# Patient Record
Sex: Male | Born: 1945 | Race: White | Hispanic: No | Marital: Married | Smoking: Never smoker
Health system: Southern US, Community
[De-identification: ages and names within clinical notes are randomized; demographics above are authoritative.]

## PROBLEM LIST (undated history)

## (undated) DIAGNOSIS — Z789 Other specified health status: Secondary | ICD-10-CM

## (undated) DIAGNOSIS — M1990 Unspecified osteoarthritis, unspecified site: Secondary | ICD-10-CM

## (undated) DIAGNOSIS — I2510 Atherosclerotic heart disease of native coronary artery without angina pectoris: Secondary | ICD-10-CM

## (undated) DIAGNOSIS — C61 Malignant neoplasm of prostate: Secondary | ICD-10-CM

## (undated) HISTORY — PX: EYE SURGERY: SHX253

## (undated) HISTORY — PX: SHOULDER ARTHROSCOPY: SHX128

## (undated) HISTORY — PX: KNEE ARTHROSCOPY: SHX127

## (undated) HISTORY — PX: TRIGGER FINGER RELEASE: SHX641

## (undated) HISTORY — PX: DUPUYTREN / PALMAR FASCIOTOMY: SUR601

## (undated) HISTORY — PX: COLONOSCOPY: SHX174

---

## 1997-10-25 ENCOUNTER — Ambulatory Visit (HOSPITAL_COMMUNITY): Admission: RE | Admit: 1997-10-25 | Discharge: 1997-10-25 | Payer: Self-pay | Admitting: Orthopedic Surgery

## 1998-06-01 ENCOUNTER — Other Ambulatory Visit: Admission: RE | Admit: 1998-06-01 | Discharge: 1998-06-01 | Payer: Self-pay | Admitting: Orthopedic Surgery

## 1999-08-16 ENCOUNTER — Ambulatory Visit (HOSPITAL_COMMUNITY): Admission: RE | Admit: 1999-08-16 | Discharge: 1999-08-16 | Payer: Self-pay | Admitting: Neurosurgery

## 1999-08-16 ENCOUNTER — Encounter: Payer: Self-pay | Admitting: Neurosurgery

## 1999-09-03 ENCOUNTER — Encounter: Payer: Self-pay | Admitting: Neurosurgery

## 1999-09-03 ENCOUNTER — Ambulatory Visit (HOSPITAL_COMMUNITY): Admission: RE | Admit: 1999-09-03 | Discharge: 1999-09-03 | Payer: Self-pay | Admitting: Neurosurgery

## 2003-02-08 ENCOUNTER — Encounter: Admission: RE | Admit: 2003-02-08 | Discharge: 2003-02-08 | Payer: Self-pay | Admitting: Neurosurgery

## 2003-02-22 ENCOUNTER — Encounter: Admission: RE | Admit: 2003-02-22 | Discharge: 2003-02-22 | Payer: Self-pay | Admitting: Neurosurgery

## 2003-03-08 ENCOUNTER — Encounter: Admission: RE | Admit: 2003-03-08 | Discharge: 2003-03-08 | Payer: Self-pay | Admitting: Neurosurgery

## 2006-02-11 ENCOUNTER — Ambulatory Visit (HOSPITAL_BASED_OUTPATIENT_CLINIC_OR_DEPARTMENT_OTHER): Admission: RE | Admit: 2006-02-11 | Discharge: 2006-02-11 | Payer: Self-pay | Admitting: Orthopedic Surgery

## 2011-05-22 ENCOUNTER — Encounter (HOSPITAL_BASED_OUTPATIENT_CLINIC_OR_DEPARTMENT_OTHER): Payer: Self-pay | Admitting: *Deleted

## 2011-05-22 NOTE — Progress Notes (Signed)
No labs needed Active To bring boot and crutches

## 2011-05-23 ENCOUNTER — Encounter (HOSPITAL_BASED_OUTPATIENT_CLINIC_OR_DEPARTMENT_OTHER)
Admission: RE | Disposition: A | Discharge status: Home / Self Care | Payer: Self-pay | Source: Ambulatory Visit | Attending: Orthopedic Surgery

## 2011-05-23 ENCOUNTER — Encounter (HOSPITAL_BASED_OUTPATIENT_CLINIC_OR_DEPARTMENT_OTHER): Payer: Self-pay | Admitting: Anesthesiology

## 2011-05-23 ENCOUNTER — Ambulatory Visit (HOSPITAL_BASED_OUTPATIENT_CLINIC_OR_DEPARTMENT_OTHER)
Admission: RE | Admit: 2011-05-23 | Discharge: 2011-05-23 | Disposition: A | Discharge status: Home / Self Care | Payer: Medicare Other | Source: Ambulatory Visit | Attending: Orthopedic Surgery | Admitting: Orthopedic Surgery

## 2011-05-23 ENCOUNTER — Encounter (HOSPITAL_BASED_OUTPATIENT_CLINIC_OR_DEPARTMENT_OTHER): Payer: Self-pay

## 2011-05-23 ENCOUNTER — Ambulatory Visit (HOSPITAL_BASED_OUTPATIENT_CLINIC_OR_DEPARTMENT_OTHER): Payer: Medicare Other | Admitting: Anesthesiology

## 2011-05-23 DIAGNOSIS — S93499A Sprain of other ligament of unspecified ankle, initial encounter: Secondary | ICD-10-CM | POA: Insufficient documentation

## 2011-05-23 DIAGNOSIS — Z4789 Encounter for other orthopedic aftercare: Secondary | ICD-10-CM

## 2011-05-23 DIAGNOSIS — X58XXXA Exposure to other specified factors, initial encounter: Secondary | ICD-10-CM | POA: Insufficient documentation

## 2011-05-23 HISTORY — DX: Unspecified osteoarthritis, unspecified site: M19.90

## 2011-05-23 HISTORY — DX: Other specified health status: Z78.9

## 2011-05-23 HISTORY — PX: ACHILLES TENDON SURGERY: SHX542

## 2011-05-23 SURGERY — REPAIR, TENDON, ACHILLES
Anesthesia: General | Laterality: Left

## 2011-05-23 MED ORDER — LIDOCAINE HCL (CARDIAC) 20 MG/ML IV SOLN
INTRAVENOUS | Status: DC | PRN
  Administered 2011-05-23: 50 mg via INTRAVENOUS

## 2011-05-23 MED ORDER — MIDAZOLAM HCL 2 MG/2ML IJ SOLN
1.0000 mg | INTRAMUSCULAR | Status: DC | PRN
  Administered 2011-05-23: 2 mg via INTRAVENOUS

## 2011-05-23 MED ORDER — LORAZEPAM 2 MG/ML IJ SOLN: 1.0000 mg | Freq: Once | INTRAMUSCULAR | Status: DC | PRN

## 2011-05-23 MED ORDER — FENTANYL CITRATE 0.05 MG/ML IJ SOLN
50.0000 ug | INTRAMUSCULAR | Status: DC | PRN
  Administered 2011-05-23: 100 ug via INTRAVENOUS

## 2011-05-23 MED ORDER — ONDANSETRON HCL 4 MG/2ML IJ SOLN
INTRAMUSCULAR | Status: DC | PRN
  Administered 2011-05-23: 4 mg via INTRAVENOUS

## 2011-05-23 MED ORDER — SUCCINYLCHOLINE CHLORIDE 20 MG/ML IJ SOLN
INTRAMUSCULAR | Status: DC | PRN
  Administered 2011-05-23: 100 mg via INTRAVENOUS

## 2011-05-23 MED ORDER — LACTATED RINGERS IV SOLN
INTRAVENOUS | Status: DC
  Administered 2011-05-23: 13:00:00 via INTRAVENOUS

## 2011-05-23 MED ORDER — DEXAMETHASONE SODIUM PHOSPHATE 4 MG/ML IJ SOLN
INTRAMUSCULAR | Status: DC | PRN
  Administered 2011-05-23: 10 mg via INTRAVENOUS

## 2011-05-23 MED ORDER — CEFAZOLIN SODIUM-DEXTROSE 2-3 GM-% IV SOLR
2.0000 g | Freq: Once | INTRAVENOUS | Status: AC
  Administered 2011-05-23: 2 g via INTRAVENOUS

## 2011-05-23 MED ORDER — HYDROMORPHONE HCL PF 1 MG/ML IJ SOLN: 0.2500 mg | INTRAMUSCULAR | Status: DC | PRN

## 2011-05-23 MED ORDER — PROPOFOL 10 MG/ML IV EMUL
INTRAVENOUS | Status: DC | PRN
  Administered 2011-05-23: 200 mg via INTRAVENOUS

## 2011-05-23 SURGICAL SUPPLY — 67 items
ANCHOR CORKSCREW FIBER 5.5X15 (Anchor) ×2 IMPLANT
BANDAGE ELASTIC 4 VELCRO ST LF (GAUZE/BANDAGES/DRESSINGS) ×2 IMPLANT
BANDAGE ELASTIC 6 VELCRO ST LF (GAUZE/BANDAGES/DRESSINGS) ×2 IMPLANT
BANDAGE ESMARK 6X9 LF (GAUZE/BANDAGES/DRESSINGS) ×1 IMPLANT
BLADE SURG 15 STRL LF DISP TIS (BLADE) ×1 IMPLANT
BLADE SURG 15 STRL SS (BLADE) ×1
BLADE SURG ROTATE 9660 (MISCELLANEOUS) ×2 IMPLANT
BNDG COHESIVE 4X5 TAN STRL (GAUZE/BANDAGES/DRESSINGS) ×2 IMPLANT
BNDG ESMARK 6X9 LF (GAUZE/BANDAGES/DRESSINGS) ×2
CANISTER SUCTION 1200CC (MISCELLANEOUS) ×2 IMPLANT
CLOTH BEACON ORANGE TIMEOUT ST (SAFETY) ×2 IMPLANT
COVER TABLE BACK 60X90 (DRAPES) ×2 IMPLANT
CUFF TOURNIQUET SINGLE 34IN LL (TOURNIQUET CUFF) ×2 IMPLANT
DRAPE EXTREMITY T 121X128X90 (DRAPE) ×2 IMPLANT
DRAPE OEC MINIVIEW 54X84 (DRAPES) ×2 IMPLANT
DRAPE U 20/CS (DRAPES) ×2 IMPLANT
DRAPE U-SHAPE 47X51 STRL (DRAPES) ×2 IMPLANT
DRSG PAD ABDOMINAL 8X10 ST (GAUZE/BANDAGES/DRESSINGS) ×2 IMPLANT
DURAPREP 26ML APPLICATOR (WOUND CARE) ×2 IMPLANT
ELECT REM PT RETURN 9FT ADLT (ELECTROSURGICAL) ×2
ELECTRODE REM PT RTRN 9FT ADLT (ELECTROSURGICAL) ×1 IMPLANT
GAUZE XEROFORM 1X8 LF (GAUZE/BANDAGES/DRESSINGS) ×2 IMPLANT
GLOVE BIO SURGEON STRL SZ 6.5 (GLOVE) ×2 IMPLANT
GLOVE BIOGEL PI IND STRL 7.0 (GLOVE) ×1 IMPLANT
GLOVE BIOGEL PI IND STRL 8 (GLOVE) ×1 IMPLANT
GLOVE BIOGEL PI INDICATOR 7.0 (GLOVE) ×1
GLOVE BIOGEL PI INDICATOR 8 (GLOVE) ×1
GLOVE ORTHO TXT STRL SZ7.5 (GLOVE) ×4 IMPLANT
GOWN BRE IMP PREV XXLGXLNG (GOWN DISPOSABLE) ×2 IMPLANT
GOWN PREVENTION PLUS XLARGE (GOWN DISPOSABLE) ×4 IMPLANT
NDL SUT 6 .5 CRC .975X.05 MAYO (NEEDLE) ×1 IMPLANT
NEEDLE HYPO 22GX1.5 SAFETY (NEEDLE) IMPLANT
NEEDLE MAYO TAPER (NEEDLE) ×1
NS IRRIG 1000ML POUR BTL (IV SOLUTION) ×2 IMPLANT
PACK BASIN DAY SURGERY FS (CUSTOM PROCEDURE TRAY) ×2 IMPLANT
PAD CAST 4YDX4 CTTN HI CHSV (CAST SUPPLIES) ×1 IMPLANT
PADDING CAST ABS 4INX4YD NS (CAST SUPPLIES) ×2
PADDING CAST ABS COTTON 4X4 ST (CAST SUPPLIES) ×2 IMPLANT
PADDING CAST COTTON 4X4 STRL (CAST SUPPLIES) ×1
PADDING CAST COTTON 6X4 STRL (CAST SUPPLIES) ×2 IMPLANT
PENCIL BUTTON HOLSTER BLD 10FT (ELECTRODE) ×2 IMPLANT
SLEEVE SCD COMPRESS KNEE MED (MISCELLANEOUS) IMPLANT
SPLINT FAST PLASTER 5X30 (CAST SUPPLIES)
SPLINT FIBERGLASS 4X30 (CAST SUPPLIES) ×2 IMPLANT
SPLINT PLASTER CAST FAST 5X30 (CAST SUPPLIES) IMPLANT
SPONGE GAUZE 4X4 12PLY (GAUZE/BANDAGES/DRESSINGS) ×2 IMPLANT
STAPLER VISISTAT 35W (STAPLE) IMPLANT
STOCKINETTE 6  STRL (DRAPES) ×1
STOCKINETTE 6 STRL (DRAPES) ×1 IMPLANT
SUCTION FRAZIER TIP 10 FR DISP (SUCTIONS) IMPLANT
SUT ETHILON 3 0 PS 1 (SUTURE) IMPLANT
SUT FIBERWIRE #2 38 T-5 BLUE (SUTURE) ×8
SUT VIC AB 0 CT1 27 (SUTURE) ×1
SUT VIC AB 0 CT1 27XBRD ANBCTR (SUTURE) ×1 IMPLANT
SUT VIC AB 2-0 SH 27 (SUTURE)
SUT VIC AB 2-0 SH 27XBRD (SUTURE) IMPLANT
SUT VIC AB 3-0 SH 27 (SUTURE) ×1
SUT VIC AB 3-0 SH 27X BRD (SUTURE) ×1 IMPLANT
SUTURE FIBERWR #2 38 T-5 BLUE (SUTURE) ×4 IMPLANT
SYR BULB 3OZ (MISCELLANEOUS) ×2 IMPLANT
SYR CONTROL 10ML LL (SYRINGE) IMPLANT
TOWEL OR 17X24 6PK STRL BLUE (TOWEL DISPOSABLE) ×4 IMPLANT
TOWEL OR NON WOVEN STRL DISP B (DISPOSABLE) ×2 IMPLANT
TUBE CONNECTING 20X1/4 (TUBING) ×2 IMPLANT
UNDERPAD 30X30 INCONTINENT (UNDERPADS AND DIAPERS) IMPLANT
WATER STERILE IRR 1000ML POUR (IV SOLUTION) IMPLANT
YANKAUER SUCT BULB TIP NO VENT (SUCTIONS) ×2 IMPLANT

## 2011-05-23 NOTE — H&P (Signed)
  Hard copy of H&P attached to chart  66 yo male acute achilles rupture left confirmed by exam and ultrasound  Brought to OR for repair of LEFT Achilles tendon  All aspects of diagnosis and treatment options discussed. Risks, benefits, complications review

## 2011-05-23 NOTE — Transfer of Care (Signed)
Immediate Anesthesia Transfer of Care Note  Patient: Vernon Thomas  Procedure(s) Performed: Procedure(s) (LRB): ACHILLES TENDON REPAIR (Left)  Patient Location: PACU  Anesthesia Type: General  Level of Consciousness: awake  Airway & Oxygen Therapy: Patient Spontanous Breathing and Patient connected to face mask oxygen  Post-op Assessment: Report given to PACU RN and Post -op Vital signs reviewed and stable  Post vital signs: Reviewed and stable  Complications: No apparent anesthesia complications

## 2011-05-23 NOTE — Progress Notes (Signed)
Assisted Dr. Kasik with left, popliteal block. Side rails up, monitors on throughout procedure. See vital signs in flow sheet. Tolerated Procedure well. 

## 2011-05-23 NOTE — Interval H&P Note (Signed)
History and Physical Interval Note:  05/23/2011 12:54 PM  Ward Givens  has presented today for surgery, with the diagnosis of left ankle rupture tendon achilles  The various methods of treatment have been discussed with the patient and family. After consideration of risks, benefits and other options for treatment, the patient has consented to  Procedure(s) (LRB): ACHILLES TENDON REPAIR (Left) as a surgical intervention .  The patients' history has been reviewed, patient examined, no change in status, stable for surgery.  I have reviewed the patients' chart and labs.  Questions were answered to the patient's satisfaction.     MURPHY,DANIEL F

## 2011-05-23 NOTE — Anesthesia Postprocedure Evaluation (Signed)
Anesthesia Post Note  Patient: Vernon Thomas  Procedure(s) Performed: Procedure(s) (LRB): ACHILLES TENDON REPAIR (Left)  Anesthesia type: General  Patient location: PACU  Post pain: Pain level controlled and Adequate analgesia  Post assessment: Post-op Vital signs reviewed, Patient's Cardiovascular Status Stable, Respiratory Function Stable, Patent Airway and Pain level controlled  Last Vitals:  Filed Vitals:   05/23/11 1445  BP: 128/77  Pulse: 86  Temp:   Resp: 19    Post vital signs: Reviewed and stable  Level of consciousness: awake, alert  and oriented  Complications: No apparent anesthesia complications

## 2011-05-23 NOTE — Brief Op Note (Signed)
05/23/2011  2:44 PM  PATIENT:  Vernon Thomas  66 y.o. male  PRE-OPERATIVE DIAGNOSIS:  left ankle rupture tendon achilles  POST-OPERATIVE DIAGNOSIS:  left ankle rupture tendon achilles  PROCEDURE:  Procedure(s) (LRB): ACHILLES TENDON REPAIR (Left)  SURGEON:  Surgeon(s) and Role:    * Loreta Ave, MD - Primary  PHYSICIAN ASSISTANT: Zonia Kief M   ANESTHESIA:   general  EBL:  Total I/O In: 1500 [I.V.:1500] Out: -    DISPOSITION OF SPECIMEN:  N/A  COUNTS:  YES  TOURNIQUET:   Total Tourniquet Time Documented: Thigh (Left) - 62 minutes  PATIENT DISPOSITION:  PACU - hemodynamically stable.

## 2011-05-23 NOTE — Anesthesia Preprocedure Evaluation (Signed)
Anesthesia Evaluation  Patient identified by MRN, date of birth, ID band Patient awake    Reviewed: Allergy & Precautions, H&P , NPO status , Patient's Chart, lab work & pertinent test results  Airway Mallampati: I TM Distance: >3 FB Neck ROM: Full    Dental   Pulmonary    Pulmonary exam normal       Cardiovascular     Neuro/Psych    GI/Hepatic   Endo/Other    Renal/GU      Musculoskeletal   Abdominal   Peds  Hematology   Anesthesia Other Findings   Reproductive/Obstetrics                           Anesthesia Physical Anesthesia Plan  ASA: I  Anesthesia Plan: General   Post-op Pain Management:    Induction: Intravenous  Airway Management Planned: Oral ETT  Additional Equipment:   Intra-op Plan:   Post-operative Plan: Extubation in OR  Informed Consent: I have reviewed the patients History and Physical, chart, labs and discussed the procedure including the risks, benefits and alternatives for the proposed anesthesia with the patient or authorized representative who has indicated his/her understanding and acceptance.     Plan Discussed with: CRNA and Surgeon  Anesthesia Plan Comments:         Anesthesia Quick Evaluation  

## 2011-05-23 NOTE — Discharge Instructions (Signed)
   [facility]  [address] [place], [place] [address] [phone]   Post Anesthesia Home Care Instructions  Activity: Get plenty of rest for the remainder of the day. A responsible adult should stay with you for 24 hours following the procedure.  For the next 24 hours, DO NOT: -Drive a car -Advertising copywriter -Drink alcoholic beverages -Take any medication unless instructed by your physician -Make any legal decisions or sign important papers.  Meals: Start with liquid foods such as gelatin or soup. Progress to regular foods as tolerated. Avoid greasy, spicy, heavy foods. If nausea and/or vomiting occur, drink only clear liquids until the nausea and/or vomiting subsides. Call your physician if vomiting continues.  Special Instructions/Symptoms: Your throat may feel dry or sore from the anesthesia or the breathing tube placed in your throat during surgery. If this causes discomfort, gargle with warm salt water. The discomfort should disappear within 24 hours.     Anesthesia Blocks  1. Numbness or the inability to move the "blocked" extremity may last from 3-48 hours after placement. The length of time depends on the medication injected and your individual response to the medication. If the numbness is not going away after 48 hours, call your surgeon.  2. The extremity that is blocked will need to be protected until the numbness is gone and the  Strength has returned. Because you cannot feel it, you will need to take extra care to avoid injury. Because it may be weak, you may have difficulty moving it or using it. You may not know what position it is in without looking at it while the block is in effect.  3. For blocks in the legs and feet, returning to weight bearing and walking needs to be done carefully. You will need to wait until the numbness is entirely gone and the strength has returned. You should be able to move your leg and foot normally before you try  and bear weight or walk. You will need someone to be with you when you first try to ensure you do not fall and possibly risk injury.  4. Bruising and tenderness at the needle site are common side effects and will resolve in a few days.  5. Persistent numbness or new problems with movement should be communicated to the surgeon or the [facility] [phone]).

## 2011-05-23 NOTE — Anesthesia Procedure Notes (Addendum)
Anesthesia Regional Block:  Popliteal block  Pre-Anesthetic Checklist: ,, timeout performed, Correct Patient, Correct Site, Correct Laterality, Correct Procedure, Correct Position, site marked, Risks and benefits discussed,  Surgical consent,  Pre-op evaluation,  At surgeon's request and post-op pain management  Laterality: Left  Prep: chloraprep       Needles:  Injection technique: Single-shot  Needle Type: Stimulator Needle - 80      Needle Gauge: 22 and 22 G    Additional Needles:  Procedures: nerve stimulator Popliteal block  Nerve Stimulator or Paresthesia:  Response: 0.5 mA,   Additional Responses:   Narrative:  Start time: 05/23/2011 12:40 PM End time: 05/23/2011 12:54 PM Injection made incrementally with aspirations every 5 mL. Anesthesiologist: Dr Gypsy Balsam  Additional Notes: [id number] POP L Pop N Block CHG prep, sterile tech, barrier precautions #22 stim neddle with stim down to .5ma Multiple neg asp Vernia Buff .5% w/epi1:200000 total 50cc No compl Dr Gypsy Balsam   Procedure Name: Intubation Performed by: York Grice Pre-anesthesia Checklist: Patient identified, Timeout performed, Emergency Drugs available, Suction available and Patient being monitored Patient Re-evaluated:Patient Re-evaluated prior to inductionOxygen Delivery Method: Circle system utilized Preoxygenation: Pre-oxygenation with 100% oxygen Intubation Type: IV induction Ventilation: Mask ventilation without difficulty Laryngoscope Size: Miller and 2 Grade View: Grade I Tube type: Oral Tube size: 8.0 mm Number of attempts: 2 Airway Equipment and Method: Video-laryngoscopy Placement Confirmation: breath sounds checked- equal and bilateral,  ETT inserted through vocal cords under direct vision and positive ETCO2 Secured at: 22 cm Tube secured with: Tape Dental Injury: Teeth and Oropharynx as per pre-operative assessment

## 2011-05-28 ENCOUNTER — Encounter (HOSPITAL_BASED_OUTPATIENT_CLINIC_OR_DEPARTMENT_OTHER): Payer: Self-pay | Admitting: Orthopedic Surgery

## 2011-05-28 NOTE — Op Note (Signed)
NAME:  Vernon Thomas, Vernon Thomas                 ACCOUNT NO.:  MEDICAL RECORD NO.:  [id number]  LOCATION:                                 FACILITY:  PHYSICIAN:  Loreta Ave, M.D.      DATE OF BIRTH:  DATE OF PROCEDURE:  05/23/2011 DATE OF DISCHARGE:                              OPERATIVE REPORT   PREOPERATIVE DIAGNOSIS:  Mid-substance Achilles tendon tear, left.  POSTOPERATIVE DIAGNOSES:  Complex 2-layer tear Achilles tendon.  Deep- half mid-substance watershed region.  Superficial-half avulsed off the os calcis with small bony fragments.  PROCEDURE:  Exploration of left Achilles and repair of mid-substance tendon tear.  Re-attachment of distal tear with a 5.5 Bio-Anchor and FiberWire suture.  SURGEON:  Loreta Ave, MD  ASSISTANT:  Genene Churn. Barry Dienes, [place], present throughout the entire case and necessary for timely completion of the procedure.  ANESTHESIA:  General.  BLOOD LOSS:  Minimal.  SPECIMENS:  None.  CULTURES:  None.  COMPLICATION:  None.  DRESSINGS:  Soft compressive short leg splint.  TOURNIQUET TIME:  One hour.  PROCEDURE:  The patient was brought to the operating room and placed on the operating table in supine position.  After adequate anesthesia had been obtained, tourniquet applied left leg.  Turned to appropriate position with appropriate padding and support in a prone position.  The leg was exsanguinated with elevation and Esmarch, tourniquet inflated to 350 mmHg.  Defected tendon in the watershed region.  A longitudinal incision was slightly lateral.  Skin and subcutaneous tissue divided. Tear immediately evident.  This was carefully dissected out and it became more, more apparent.  There was a two-layer tear.  I carried the incision all way down to the level of the os calcis.  After debriding all of this, there was extensive intratendinous tearing at both ends. The more superficial-half had avulsed off the os calcis with bone fragments within it.   Fluoroscopic guidance was used to confirm that. The os calcis avulsed off was debrided and a 5-mm Bio-Anchor with FiberWire suture was placed there.  I then reflected the superficial tear approximately.  I then did an end-to-end repair of the deep tear weaving the FiberWire suture well above and well below the tendon- capturing stitch.  This was then tied end-to-end with appropriate tension.  The superficial tear brought over top of that and then that was firmly tied down to the os calcis with a FiberWire suture weaved well up above the other tear and then back down restoring good longitudinal continuity and tensioning of the entire tendon not only the mid-substance tear but down to the heel.  I could dorsiflex slightly with the knee flexed confirming not too much tension.  Wound was irrigated.  Closed with Vicryl and staples.  Sterile compressive dressing applied.  A plantar gravity flexed position of the foot, short- leg splint applied.  Returned to supine position.  Anesthesia reversed. Brought to the recovery room.  Tolerated the surgery well.  There were no complications.     Loreta Ave, M.D.     DFM/MEDQ  D:  05/27/2011  T:  05/27/2011  Job:  [id number]

## 2014-02-25 HISTORY — PX: OTHER SURGICAL HISTORY: SHX169

## 2014-03-01 ENCOUNTER — Encounter: Payer: Self-pay | Admitting: Gastroenterology

## 2014-03-11 ENCOUNTER — Ambulatory Visit (AMBULATORY_SURGERY_CENTER): Payer: Self-pay | Admitting: *Deleted

## 2014-03-11 VITALS — Ht 68.0 in | Wt 181.0 lb

## 2014-03-11 DIAGNOSIS — Z1211 Encounter for screening for malignant neoplasm of colon: Secondary | ICD-10-CM

## 2014-03-11 MED ORDER — NA SULFATE-K SULFATE-MG SULF 17.5-3.13-1.6 GM/177ML PO SOLN: 1.0000 | Freq: Once | ORAL | Status: DC

## 2014-03-11 NOTE — Progress Notes (Signed)
No egg or soy allergy, no issues with past sedation, no home 02 , no diet pills. ewm Pt declined emmi video. ewm

## 2014-03-25 ENCOUNTER — Ambulatory Visit (AMBULATORY_SURGERY_CENTER): Payer: PPO | Admitting: Gastroenterology

## 2014-03-25 ENCOUNTER — Encounter: Payer: Self-pay | Admitting: Gastroenterology

## 2014-03-25 VITALS — BP 117/67 | HR 48 | Temp 96.5°F | Resp 17 | Ht 68.0 in | Wt 181.0 lb

## 2014-03-25 DIAGNOSIS — Z1211 Encounter for screening for malignant neoplasm of colon: Secondary | ICD-10-CM

## 2014-03-25 MED ORDER — SODIUM CHLORIDE 0.9 % IV SOLN: 500.0000 mL | INTRAVENOUS | Status: DC

## 2014-03-25 NOTE — Progress Notes (Signed)
Report to PACU, RN, vss, BBS= Clear.  

## 2014-03-25 NOTE — Op Note (Signed)
[place]  [address] [place], [address]   COLONOSCOPY PROCEDURE REPORT  PATIENT: Vernon Thomas, Vernon Thomas  MR#: [id number] BIRTHDATE: October 30, 1945 , 33  yrs. old GENDER: male ENDOSCOPIST: Inda Castle, MD REFERRED HW:TUUEK McKenzie, M.D. PROCEDURE DATE:  03/25/2014 PROCEDURE:   Colonoscopy, diagnostic First Screening Colonoscopy - Avg.  risk and is 50 yrs.  old or older - No.  Prior Negative Screening - Now for repeat screening. 10 or more years since last screening  History of Adenoma - Now for follow-up colonoscopy & has been > or = to 3 yrs.  N/A  Polyps Removed Today? No.  Recommend repeat exam, <10 yrs? No. ASA CLASS:   Class II INDICATIONS:average risk for colon cancer. MEDICATIONS: Monitored anesthesia care and Propofol 200 mg IV  DESCRIPTION OF PROCEDURE:   After the risks benefits and alternatives of the procedure were thoroughly explained, informed consent was obtained.  The digital rectal exam revealed no abnormalities of the rectum.   The LB [id number]  endoscope was introduced through the anus and advanced to the cecum, which was identified by both the appendix and ileocecal valve. No adverse events experienced.   The quality of the prep was good, using MoviPrep  The instrument was then slowly withdrawn as the colon was fully examined.      COLON FINDINGS: A normal appearing cecum, ileocecal valve, and appendiceal orifice were identified.  the ascending, transverse, descending, sigmoid colon, and rectum appeared unremarkable. Retroflexed views revealed no abnormalities. The time to cecum=5 minutes 52 seconds.  Withdrawal time=7 minutes 57 seconds.  The scope was withdrawn and the procedure completed. COMPLICATIONS: There were no complications.  ENDOSCOPIC IMPRESSION: Normal colonoscopy  RECOMMENDATIONS: Given your age, you will not need another colonoscopy for colon cancer screening or polyp surveillance.  These types of  tests usually stop around the age 61.  eSigned:  Inda Castle, MD 03/25/2014 1:24 PM   cc:

## 2014-03-25 NOTE — Patient Instructions (Signed)
Impressions/recommendations:  Normal colonoscopy  YOU HAD AN ENDOSCOPIC PROCEDURE TODAY AT [facility]: Refer to the procedure report that was given to you for any specific questions about what was found during the examination.  If the procedure report does not answer your questions, please call your gastroenterologist to clarify.  If you requested that your care partner not be given the details of your procedure findings, then the procedure report has been included in a sealed envelope for you to review at your convenience later.  YOU SHOULD EXPECT: Some feelings of bloating in the abdomen. Passage of more gas than usual.  Walking can help get rid of the air that was put into your GI tract during the procedure and reduce the bloating. If you had a lower endoscopy (such as a colonoscopy or flexible sigmoidoscopy) you may notice spotting of blood in your stool or on the toilet paper. If you underwent a bowel prep for your procedure, then you may not have a normal bowel movement for a few days.  DIET: Your first meal following the procedure should be a light meal and then it is ok to progress to your normal diet.  A half-sandwich or bowl of soup is an example of a good first meal.  Heavy or fried foods are harder to digest and may make you feel nauseous or bloated.  Likewise meals heavy in dairy and vegetables can cause extra gas to form and this can also increase the bloating.  Drink plenty of fluids but you should avoid alcoholic beverages for 24 hours.  ACTIVITY: Your care partner should take you home directly after the procedure.  You should plan to take it easy, moving slowly for the rest of the day.  You can resume normal activity the day after the procedure however you should NOT DRIVE or use heavy machinery for 24 hours (because of the sedation medicines used during the test).    SYMPTOMS TO REPORT IMMEDIATELY: A gastroenterologist can be reached at any hour.  During normal  business hours, 8:30 AM to 5:00 PM Monday through Friday, call [phone].  After hours and on weekends, please call the GI answering service at [phone] who will take a message and have the physician on call contact you.   Following lower endoscopy (colonoscopy or flexible sigmoidoscopy):  Excessive amounts of blood in the stool  Significant tenderness or worsening of abdominal pains  Swelling of the abdomen that is new, acute  Fever of 100F or higher  FOLLOW UP: If any biopsies were taken you will be contacted by phone or by letter within the next 1-3 weeks.  Call your gastroenterologist if you have not heard about the biopsies in 3 weeks.  Our staff will call the home number listed on your records the next business day following your procedure to check on you and address any questions or concerns that you may have at that time regarding the information given to you following your procedure. This is a courtesy call and so if there is no answer at the home number and we have not heard from you through the emergency physician on call, we will assume that you have returned to your regular daily activities without incident.  SIGNATURES/CONFIDENTIALITY: You and/or your care partner have signed paperwork which will be entered into your electronic medical record.  These signatures attest to the fact that that the information above on your After Visit Summary has been reviewed and is understood.  Full responsibility  of the confidentiality of this discharge information lies with you and/or your care-partner.

## 2014-03-28 ENCOUNTER — Telehealth: Payer: Self-pay | Admitting: *Deleted

## 2014-03-28 NOTE — Telephone Encounter (Signed)
  Follow up Call-  Call back number 03/25/2014  Post procedure Call Back phone  # [phone]  Permission to leave phone message Yes    [facility]

## 2014-04-22 DIAGNOSIS — I200 Unstable angina: Secondary | ICD-10-CM

## 2014-04-26 ENCOUNTER — Encounter (HOSPITAL_COMMUNITY)
Admission: RE | Disposition: A | Discharge status: Home / Self Care | Payer: PPO | Source: Ambulatory Visit | Attending: Cardiology

## 2014-04-26 ENCOUNTER — Ambulatory Visit (HOSPITAL_COMMUNITY)
Admission: RE | Admit: 2014-04-26 | Discharge: 2014-04-27 | Disposition: A | Discharge status: Home / Self Care | Payer: PPO | Source: Ambulatory Visit | Attending: Cardiology | Admitting: Cardiology

## 2014-04-26 ENCOUNTER — Other Ambulatory Visit: Payer: Self-pay

## 2014-04-26 DIAGNOSIS — I200 Unstable angina: Secondary | ICD-10-CM

## 2014-04-26 DIAGNOSIS — I2510 Atherosclerotic heart disease of native coronary artery without angina pectoris: Secondary | ICD-10-CM | POA: Insufficient documentation

## 2014-04-26 DIAGNOSIS — E780 Pure hypercholesterolemia: Secondary | ICD-10-CM | POA: Diagnosis not present

## 2014-04-26 DIAGNOSIS — Z7982 Long term (current) use of aspirin: Secondary | ICD-10-CM | POA: Diagnosis not present

## 2014-04-26 DIAGNOSIS — E785 Hyperlipidemia, unspecified: Secondary | ICD-10-CM | POA: Diagnosis not present

## 2014-04-26 DIAGNOSIS — Z9861 Coronary angioplasty status: Secondary | ICD-10-CM

## 2014-04-26 DIAGNOSIS — M1990 Unspecified osteoarthritis, unspecified site: Secondary | ICD-10-CM | POA: Insufficient documentation

## 2014-04-26 HISTORY — PX: LEFT HEART CATHETERIZATION WITH CORONARY ANGIOGRAM: SHX5451

## 2014-04-26 LAB — BASIC METABOLIC PANEL
Anion gap: 6 (ref 5–15)
BUN: 21 mg/dL (ref 6–23)
CO2: 28 mmol/L (ref 19–32)
CREATININE: 1.1 mg/dL (ref 0.50–1.35)
Calcium: 9 mg/dL (ref 8.4–10.5)
Chloride: 106 mmol/L (ref 96–112)
GFR, EST AFRICAN AMERICAN: 76 mL/min — AB (ref >90)
GFR, EST NON AFRICAN AMERICAN: 66 mL/min — AB (ref >90)
Glucose, Bld: 105 mg/dL — ABNORMAL HIGH (ref 70–99)
POTASSIUM: 4 mmol/L (ref 3.5–5.1)
Sodium: 140 mmol/L (ref 135–145)

## 2014-04-26 LAB — CBC
HCT: 44.4 % (ref 39.0–52.0)
HEMOGLOBIN: 15.2 g/dL (ref 13.0–17.0)
MCH: 32.3 pg (ref 26.0–34.0)
MCHC: 34.2 g/dL (ref 30.0–36.0)
MCV: 94.3 fL (ref 78.0–100.0)
Platelets: 189 10*3/uL (ref 150–400)
RBC: 4.71 MIL/uL (ref 4.22–5.81)
RDW: 13.7 % (ref 11.5–15.5)
WBC: 6.5 10*3/uL (ref 4.0–10.5)

## 2014-04-26 LAB — PROTIME-INR
INR: 1.05 (ref 0.00–1.49)
PROTHROMBIN TIME: 13.8 s (ref 11.6–15.2)

## 2014-04-26 LAB — POCT ACTIVATED CLOTTING TIME: ACTIVATED CLOTTING TIME: 577 s

## 2014-04-26 SURGERY — LEFT HEART CATHETERIZATION WITH CORONARY ANGIOGRAM

## 2014-04-26 MED ORDER — METOPROLOL TARTRATE 12.5 MG HALF TABLET
12.5000 mg | ORAL_TABLET | Freq: Two times a day (BID) | ORAL | Status: DC
  Administered 2014-04-27: 12.5 mg via ORAL
  Filled 2014-04-26: qty 1

## 2014-04-26 MED ORDER — ACETAMINOPHEN 325 MG PO TABS: 650.0000 mg | ORAL_TABLET | ORAL | Status: DC | PRN

## 2014-04-26 MED ORDER — ASPIRIN EC 81 MG PO TBEC: 81.0000 mg | DELAYED_RELEASE_TABLET | Freq: Every day | ORAL | Status: DC

## 2014-04-26 MED ORDER — SODIUM CHLORIDE 0.9 % IV SOLN: 250.0000 mL | INTRAVENOUS | Status: DC | PRN

## 2014-04-26 MED ORDER — HEPARIN (PORCINE) IN NACL 2-0.9 UNIT/ML-% IJ SOLN
INTRAMUSCULAR | Status: AC
  Filled 2014-04-26: qty 1000

## 2014-04-26 MED ORDER — TICAGRELOR 90 MG PO TABS
ORAL_TABLET | ORAL | Status: AC
  Filled 2014-04-26: qty 1

## 2014-04-26 MED ORDER — ADULT MULTIVITAMIN W/MINERALS CH
1.0000 | ORAL_TABLET | Freq: Every day | ORAL | Status: DC
  Filled 2014-04-26 (×2): qty 1

## 2014-04-26 MED ORDER — SODIUM CHLORIDE 0.9 % IJ SOLN: 3.0000 mL | Freq: Two times a day (BID) | INTRAMUSCULAR | Status: DC

## 2014-04-26 MED ORDER — SODIUM CHLORIDE 0.9 % IV SOLN
INTRAVENOUS | Status: DC
  Administered 2014-04-26: 12:00:00 via INTRAVENOUS

## 2014-04-26 MED ORDER — HEPARIN SODIUM (PORCINE) 1000 UNIT/ML IJ SOLN
INTRAMUSCULAR | Status: AC
  Filled 2014-04-26: qty 1

## 2014-04-26 MED ORDER — ONDANSETRON HCL 4 MG/2ML IJ SOLN: 4.0000 mg | Freq: Four times a day (QID) | INTRAMUSCULAR | Status: DC | PRN

## 2014-04-26 MED ORDER — NITROGLYCERIN 0.4 MG SL SUBL
0.4000 mg | SUBLINGUAL_TABLET | SUBLINGUAL | Status: DC | PRN
  Administered 2014-04-26: 20:00:00 0.4 mg via SUBLINGUAL
  Filled 2014-04-26: qty 1

## 2014-04-26 MED ORDER — ASPIRIN 81 MG PO CHEW
81.0000 mg | CHEWABLE_TABLET | ORAL | Status: AC
  Administered 2014-04-26: 81 mg via ORAL

## 2014-04-26 MED ORDER — HYDROMORPHONE HCL 1 MG/ML IJ SOLN
INTRAMUSCULAR | Status: AC
  Filled 2014-04-26: qty 1

## 2014-04-26 MED ORDER — SODIUM CHLORIDE 0.9 % IV SOLN
Freq: Once | INTRAVENOUS | Status: AC
  Administered 2014-04-26: 12:00:00 via INTRAVENOUS

## 2014-04-26 MED ORDER — MIDAZOLAM HCL 2 MG/2ML IJ SOLN
INTRAMUSCULAR | Status: AC
  Filled 2014-04-26: qty 2

## 2014-04-26 MED ORDER — ADENOSINE 12 MG/4ML IV SOLN
16.0000 mL | Freq: Once | INTRAVENOUS | Status: DC
  Filled 2014-04-26: qty 16

## 2014-04-26 MED ORDER — SODIUM CHLORIDE 0.9 % IJ SOLN: 3.0000 mL | INTRAMUSCULAR | Status: DC | PRN

## 2014-04-26 MED ORDER — VERAPAMIL HCL 2.5 MG/ML IV SOLN
INTRAVENOUS | Status: AC
  Filled 2014-04-26: qty 2

## 2014-04-26 MED ORDER — ATORVASTATIN CALCIUM 80 MG PO TABS
80.0000 mg | ORAL_TABLET | Freq: Every day | ORAL | Status: DC
  Administered 2014-04-27: 10:00:00 80 mg via ORAL
  Filled 2014-04-26: qty 1

## 2014-04-26 MED ORDER — NITROGLYCERIN 1 MG/10 ML FOR IR/CATH LAB
INTRA_ARTERIAL | Status: AC
  Filled 2014-04-26: qty 10

## 2014-04-26 MED ORDER — SODIUM CHLORIDE 0.9 % IV SOLN: 1.0000 mL/kg/h | INTRAVENOUS | Status: AC

## 2014-04-26 MED ORDER — LIDOCAINE HCL (PF) 1 % IJ SOLN
INTRAMUSCULAR | Status: AC
  Filled 2014-04-26: qty 30

## 2014-04-26 MED ORDER — ASPIRIN 81 MG PO CHEW
CHEWABLE_TABLET | ORAL | Status: AC
  Filled 2014-04-26: qty 1

## 2014-04-26 MED ORDER — TICAGRELOR 90 MG PO TABS
90.0000 mg | ORAL_TABLET | Freq: Two times a day (BID) | ORAL | Status: DC
  Administered 2014-04-26 – 2014-04-27 (×2): 90 mg via ORAL
  Filled 2014-04-26 (×3): qty 1

## 2014-04-26 MED ORDER — BIVALIRUDIN 250 MG IV SOLR
INTRAVENOUS | Status: AC
  Filled 2014-04-26: qty 250

## 2014-04-26 MED ORDER — TRAMADOL HCL 50 MG PO TABS: 50.0000 mg | ORAL_TABLET | Freq: Four times a day (QID) | ORAL | Status: DC | PRN

## 2014-04-26 MED ORDER — OXYCODONE-ACETAMINOPHEN 5-325 MG PO TABS
1.0000 | ORAL_TABLET | ORAL | Status: DC | PRN
  Administered 2014-04-26: 1 via ORAL
  Filled 2014-04-26: qty 1

## 2014-04-26 MED ORDER — ZOLPIDEM TARTRATE 5 MG PO TABS
5.0000 mg | ORAL_TABLET | Freq: Every evening | ORAL | Status: DC | PRN
  Administered 2014-04-26: 5 mg via ORAL
  Filled 2014-04-26: qty 1

## 2014-04-26 NOTE — CV Procedure (Signed)
Procedure performed:  Ultrasound-guided right radial arterial access.  Left heart catheterization including hemodynamic monitoring of the left ventricle, LV gram. Selective right and left coronary arteriography. PTCA and stenting of the mid LAD with implantation of 2 overlapping 3.0 x 20 and a 3.0 x 12 mm Synergy drug-eluting stent. FFR to intermediate stenosis of the right coronary artery of 50-60%.  Indication: Patient is a 69 year-old fairly active Caucasian male with history of hyperlipidemia, who presents with intermediate ornery syndrome, recent onset of chest discomfort even with minimal exertional activity, previously fairly active and exercises on a regular basis.. Patient has  had non invasive testing which was abnormal revealing severe septal ischemia.  Hence is brought to the cardiac catheterization lab to evaluate  coronary anatomy for definitive diagnosis of CAD.  Hemodynamic data: Left ventricular pressure was 105/2 with LVEDP of 12 mm mercury. Aortic pressure was 107/57 with a mean of 77 mm mercury. There was no pressure gradient across the aortic valve.   Left ventricle: Performed in the RAO projection revealed LVEF of 50-55 %. There was no significant MR. No wall motion abnormality.  Right coronary artery:  Dominant. Mildly diffusely diseased. Midsegment has a 50-60% stenosis. Gives origin to a large PDA and PL branch.  Left main coronary artery is large and has mild disease.  Circumflex coronary artery: A large vessel giving origin to a large obtuse marginal 1. There is mild luminal irregularity.  LAD:  LAD gives origin to a large diagonal-1.  Mid LAD is subtotally occluded, with the distal LAD shows mild diffuse luminal irregularity.  Ramus intermediate: Small vessel, mild disease.   Impression: High-grade subtotally occluded mid LAD, intermediate stenosis of the mid RCA.  Interventional data: Successful PTCA and stenting of the mid LAD with implantation of 2 overlapping  3.0 x 20 and a 3.0 x 12 mm Synergy drug-eluting stent. FFR to intermediate stenosis of the right coronary artery of 50-60%. No significant change in FFR, FFR was 0.96, hence lesion left alone. Will need Dual antiplatelet therapy with BRILINTA and ASA 81 mg for at least one year.   Technique of diagnostic cardiac catheterization:  Under sterile precautions using a 6 French right radial  arterial access, I had to utilize ultrasound guidance due to significant spasm in the right radial artery, a 6 French sheath was introduced into the right radial artery. A 5 [place] Tig 4 catheter was advanced into the ascending aorta selective  right coronary artery and left coronary artery was cannulated and angiography was performed in multiple views. The catheter was pulled back Out of the body over exchange length J-wire. Same Catheter was used to perform LV gram which was performed in RAO projection. Catheter exchanged out of the body over J-Wire. NO immediate complications noted.   Technique of intervention:  Using a 6 [place] XB 3.5 guide catheter the left main  coronary  was selected and cannulated. Using Angiomax for anticoagulation, I utilized a cougar XT 0.014 x 1 90 cm guidewire and across the LAD coronary artery with moderate amount of difficulty, I initially attempted to use a 2.5 x 10 mm scoring balloon, however in spite of multiple attempts, the wire would not cross due to high-grade stenosis. Finally when the wire did cross, after reshaping, there was near occlusion of the mid LAD. Hence I decided to proceed with balloon in the mid LAD with a 2.0 x 15 mm mini trek as I felt the scoring balloon would not cross the high-grade stenosis. Balloon  angioplasty at 16 atmospheric pressure was performed for 90 seconds.  I then decided to stent the lesion with a 3.0 x 20 mm Synergy DES, which was deployed at 12 atmospheric pressure for 50 seconds. Unfortunately there was significant movement of the stent during cardiac  cycle, hence the stent moved much more proximal than anticipated and missed the distal lesion. At this point I decided to cover the distal lesion with a 3.0 x 12 mm Synergy stent which was nicely overlapped and deployed at 12 atmospheric pressure for 50 seconds. The same stent balloon was utilized to perform 16 atmospheric balloon inflation within the to stent struts in the midsegment at the tightest stenotic segment. The LAD had a nice stepup and stepdown without any edge dissection and maintenance of TIMI-3 flow. The mid to distal LAD showed diffuse disease.  Guidewire withdrawn, and angiography repeated, intracoronary nitroglycerin was also administered at various levels and angiography was repeated. The guide catheter disengaged and pulled out of the body over the J-wire.  Attention was directed towards the right coronary artery. Engage the RCA with a 6 [place] Ikari right 1.0 guide catheter. I then advanced the same cougar XT into the right coronary artery and then utilized Ascist Navvus FFR system catheter to cross the mid RCA and using intravenous adenosine in a standard fashion I performed FFR. It was 0.96, insignificant. Hence the lesion was left alone, the guidewire and the FFR system was withdrawn out of the body, angiography repeated, guide catheter disengaged and pulled out of the body. Patient tolerated the procedure well. There was no immediate complication. Hemostasis was obtained by applying TR band.  Disposition: Patient will be discharged in morning unless complications with out-patient follow up.

## 2014-04-26 NOTE — Progress Notes (Signed)
Patient c/o 7/10 cp after dose of percocet I gave 1 NTG SL and EKG done I will continue to monitor.

## 2014-04-26 NOTE — Progress Notes (Signed)
Dr. Einar Gip paged and made aware that patient continues to have vague chest pressure 5-6/10.

## 2014-04-26 NOTE — Interval H&P Note (Signed)
History and Physical Interval Note:  04/26/2014 1:55 PM  Vernon Thomas  has presented today for surgery, with the diagnosis of cp/positive nuc  The various methods of treatment have been discussed with the patient and family. After consideration of risks, benefits and other options for treatment, the patient has consented to  Procedure(s): LEFT HEART CATHETERIZATION WITH CORONARY ANGIOGRAM (N/A) and possible PCI as a surgical intervention .  The patient's history has been reviewed, patient examined, no change in status, stable for surgery.  I have reviewed the patient's chart and labs.  Questions were answered to the patient's satisfaction.   Cath Lab Visit (complete for each Cath Lab visit)  Clinical Evaluation Leading to the Procedure:   ACS: No.  Non-ACS:    Anginal Classification: CCS III  Anti-ischemic medical therapy: Minimal Therapy (1 class of medications)  Non-Invasive Test Results: High-risk stress test findings: cardiac mortality >3%/year  Prior CABG: No previous CABG        [facility]

## 2014-04-26 NOTE — H&P (Signed)
Vernon Thomas is an 69 y.o. male.   Chief Complaint: Chest pain HPI: The patient is a 69 year old male who presents for an evaluation of chest pain on exertion. 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 I know him personally, he had called me on 04/22/2014 stating that about 2 weeks ago he started noticing exertional burning sensation in the chest. Over the period of 2 weeks, symptoms have progressively gotten worse, and I felt that strongly that his symptoms are suggestive of angina pectoris. I set him up for a nuclear stress test which was markedly abnormal and his risk study with frequent PVCs, ventricular triplets associated with chest pain on the treadmill. Perfusion imaging study reviewed severe septal ischemia.  Patient has had recurrence of chest discomfort even with minimal exertion activity. Patient is very active, exercises on a regular basis, eats healthy but yesterday while taking trash outside, he had again burning sensation in the chest. He is also had chest tightness and burning sensation while having sex with his wife about a week ago. All these symptoms are new and he has noticed very minimal associated dyspnea. His symptoms are suggestive of intermediate coronary syndrome since was recommended coronary angiography the following day that is today. I've seen him yesterday in the office. No new symptoms.  He denies symptoms of TIA or claudication. No recent weight changes. No bowel or bladder disturbances.  Past Medical History  Diagnosis Date  . No pertinent past medical history   . Osteoarthritis     Past Surgical History  Procedure Laterality Date  . Knee arthroscopy      both  . Dupuytren / palmar fasciotomy      lt   . Trigger finger release      multiple  . Colonoscopy    . Eye surgery     lazer torn retina rt eye  . Shoulder arthroscopy      rt  . Achilles tendon surgery  05/23/2011    Procedure: ACHILLES TENDON REPAIR;  Surgeon: Ninetta Lights, MD;  Location: [facility];  Service: Orthopedics;  Laterality: Left;  left ankle repair rupture achilles tendon with end to end asastomisis and distal reattachment    Family History  Problem Relation Age of Onset  . Colon cancer Neg Hx   . Rectal cancer Neg Hx   . Stomach cancer Neg Hx   . Diabetes Mother    Social History:  reports that he has never smoked. He has never used smokeless tobacco. He reports that he does not drink alcohol or use illicit drugs.  Allergies: No Known Allergies  Medications Prior to Admission  Medication Sig Dispense Refill  . aspirin EC 81 MG tablet Take 81 mg by mouth at bedtime.    Marland Kitchen atorvastatin (LIPITOR) 80 MG tablet Take 80 mg by mouth daily.    Marland Kitchen ibuprofen (ADVIL,MOTRIN) 200 MG tablet Take 400-600 mg by mouth every 6 (six) hours as needed (pain).     . metoprolol tartrate (LOPRESSOR) 25 MG tablet Take 25 mg by mouth 2 (two) times daily.    . Multiple Vitamin (MULTIVITAMIN WITH MINERALS) TABS tablet Take 1 tablet by mouth daily.    . naproxen sodium (ANAPROX) 220 MG tablet Take 220 mg by mouth daily as needed (pain). Aleve    . Omega-3 Fatty Acids (FISH OIL  PO) Take 1 capsule by mouth at bedtime.    . ticagrelor (BRILINTA) 90 MG TABS tablet Take 90 mg by mouth 2 (two) times daily.    . traMADol (ULTRAM) 50 MG tablet Take 50 mg by mouth every 6 (six) hours as needed (pain).     . nitroGLYCERIN (NITROSTAT) 0.4 MG SL tablet Place 0.4 mg under the tongue every 5 (five) minutes as needed for chest pain.     Review of Systems - Negative except Chest pain  Blood pressure 129/82, pulse 50, temperature 98.1 F (36.7 C), temperature source Oral, resp. rate 18, height 5' 8"  (1.727 m), weight 79.379 kg (175 lb), SpO2 100 %. General appearance: alert, cooperative, appears stated age and no  distress Eyes: negative findings: lids and lashes normal Neck: no adenopathy, no carotid bruit, no JVD, supple, symmetrical, trachea midline and thyroid not enlarged, symmetric, no tenderness/mass/nodules Neck: JVP - normal, carotids 2+= without bruits Resp: clear to auscultation bilaterally Chest wall: no tenderness Cardio: regular rate and rhythm, S1, S2 normal, no murmur, click, rub or gallop GI: soft, non-tender; bowel sounds normal; no masses,  no organomegaly Extremities: extremities normal, atraumatic, no cyanosis or edema Pulses: 2+ and symmetric Skin: Skin color, texture, turgor normal. No rashes or lesions Neurologic: Grossly normal  Results for orders placed or performed during the hospital encounter of 04/26/14 (from the past 48 hour(s))  CBC     Status: None   Collection Time: 04/26/14 11:45 AM  Result Value Ref Range   WBC 6.5 4.0 - 10.5 K/uL   RBC 4.71 4.22 - 5.81 MIL/uL   Hemoglobin 15.2 13.0 - 17.0 g/dL   HCT 44.4 39.0 - 52.0 %   MCV 94.3 78.0 - 100.0 fL   MCH 32.3 26.0 - 34.0 pg   MCHC 34.2 30.0 - 36.0 g/dL   RDW 13.7 11.5 - 15.5 %   Platelets 189 150 - 400 K/uL  Basic metabolic panel     Status: Abnormal   Collection Time: 04/26/14 11:45 AM  Result Value Ref Range   Sodium 140 135 - 145 mmol/L   Potassium 4.0 3.5 - 5.1 mmol/L   Chloride 106 96 - 112 mmol/L   CO2 28 19 - 32 mmol/L   Glucose, Bld 105 (H) 70 - 99 mg/dL   BUN 21 6 - 23 mg/dL   Creatinine, Ser 1.10 0.50 - 1.35 mg/dL   Calcium 9.0 8.4 - 10.5 mg/dL   GFR calc non Af Amer 66 (L) >90 mL/min   GFR calc Af Amer 76 (L) >90 mL/min    Comment: (NOTE) The eGFR has been calculated using the CKD EPI equation. This calculation has not been validated in all clinical situations. eGFR's persistently <90 mL/min signify possible Chronic Kidney Disease.    Anion gap 6 5 - 15  Protime-INR     Status: None   Collection Time: 04/26/14 11:45 AM  Result Value Ref Range   Prothrombin Time 13.8 11.6 - 15.2  seconds   INR 1.05 0.00 - 1.49   No results found.  Labs:   Lab Results  Component Value Date   WBC 6.5 04/26/2014   HGB 15.2 04/26/2014   HCT 44.4 04/26/2014   MCV 94.3 04/26/2014   PLT 189 04/26/2014    Recent Labs Lab 04/26/14 1145  NA 140  K 4.0  CL 106  CO2 28  BUN 21  CREATININE 1.10  CALCIUM 9.0  GLUCOSE 105*    Lipid Panel  Out Patient  Labs 02/22/2014: CBC normal, hemoglobin 16.0/hematocrit 45.5. CMP revealed BUN of 26, serum creatinine 1.22, eGFR 60 mL. Total cholesterol 220, triglycerides 54, HDL 63, LDL 146. TSH normal.  EKG: 04/25/2014: Normal sinus rhythm, normal axis. No evidence of ischemia, no medication.  Assessment/Plan 1. Angina pectoris, crescendo   2. Abnormal nuclear stress test  Exercise myoview stress 04/25/2014: 1. The resting electrocardiogram demonstrated normal sinus rhythm, normal resting conduction, no resting arrhythmias and normal rest repolarization. The stress electrocardiogram was strongly positive for myocardial ischemia with frequent PVCs, ventricular couplets and exercise induced ventricular triplets associated with chest pain. The patient performed treadmill exercise using a Bruce protocol, completing 5:30 minutes. The patient completed an estimated workload of 7.3 METS, 91% of the maximum predicted heart rate. The stress test was terminated because of fatigue, chest pain and THR met. 2. SPECT images demonstrate Medium perfusion abnormality of severe intensity in the basal anteroseptal, mid anteroseptal, mid inferoseptal and apical septal myocardial wall(s) on the stress images. The defect is not present on the resting images consistent with ischemia. Gated SPECT imaging demonstrates hypokinesisin the same myocardial wall(s). The left ventricular ejection fraction was calculated or visually estimated to be 47%. This is a high risk study.  3. Hyperlipidemia, group A    Recommendation: I have started the patient on Crestor  4 days ago,  yesterday I started him on Brilinta. 4 days ago he was also started on metoprolol. Due to ongoing symptoms and his stress test, he has been recommended coronary angiography. Patient advised distended, benefits and alternatives to coronary angiography and  willing to proceed.   Laverda Page, MD 04/26/2014, 1:43 PM [facility] [place] Pager: [phone] Office: [phone] If no answer: Cell:  [phone]

## 2014-04-26 NOTE — Progress Notes (Signed)
Family in to see. 

## 2014-04-27 ENCOUNTER — Encounter (HOSPITAL_COMMUNITY): Payer: Self-pay | Admitting: Cardiology

## 2014-04-27 DIAGNOSIS — I2510 Atherosclerotic heart disease of native coronary artery without angina pectoris: Secondary | ICD-10-CM | POA: Diagnosis not present

## 2014-04-27 DIAGNOSIS — M1990 Unspecified osteoarthritis, unspecified site: Secondary | ICD-10-CM | POA: Diagnosis not present

## 2014-04-27 DIAGNOSIS — E785 Hyperlipidemia, unspecified: Secondary | ICD-10-CM | POA: Diagnosis not present

## 2014-04-27 DIAGNOSIS — Z7982 Long term (current) use of aspirin: Secondary | ICD-10-CM | POA: Diagnosis not present

## 2014-04-27 LAB — BASIC METABOLIC PANEL
ANION GAP: 5 (ref 5–15)
BUN: 18 mg/dL (ref 6–23)
CHLORIDE: 108 mmol/L (ref 96–112)
CO2: 26 mmol/L (ref 19–32)
Calcium: 8.4 mg/dL (ref 8.4–10.5)
Creatinine, Ser: 1.12 mg/dL (ref 0.50–1.35)
GFR calc Af Amer: 74 mL/min — ABNORMAL LOW (ref >90)
GFR calc non Af Amer: 64 mL/min — ABNORMAL LOW (ref >90)
GLUCOSE: 95 mg/dL (ref 70–99)
POTASSIUM: 4.2 mmol/L (ref 3.5–5.1)
SODIUM: 139 mmol/L (ref 135–145)

## 2014-04-27 LAB — CBC
HEMATOCRIT: 39.9 % (ref 39.0–52.0)
HEMOGLOBIN: 13.8 g/dL (ref 13.0–17.0)
MCH: 32.6 pg (ref 26.0–34.0)
MCHC: 34.6 g/dL (ref 30.0–36.0)
MCV: 94.3 fL (ref 78.0–100.0)
Platelets: 163 10*3/uL (ref 150–400)
RBC: 4.23 MIL/uL (ref 4.22–5.81)
RDW: 13.7 % (ref 11.5–15.5)
WBC: 9.7 10*3/uL (ref 4.0–10.5)

## 2014-04-27 MED ORDER — METOPROLOL TARTRATE 25 MG PO TABS: 12.5000 mg | ORAL_TABLET | Freq: Two times a day (BID) | ORAL | Status: DC

## 2014-04-27 MED FILL — Sodium Chloride IV Soln 0.9%: INTRAVENOUS | Qty: 50 | Status: AC

## 2014-04-27 NOTE — Care Management Note (Signed)
    Page 1 of 1   04/27/2014     10:18:28 AM CARE MANAGEMENT NOTE 04/27/2014  Patient:  Vernon Thomas   Account Number:  [id number]  Date Initiated:  04/27/2014  Documentation initiated by:  GRAVES-BIGELOW,Juwann Sherk  Subjective/Objective Assessment:   Pt admitted for cp.     Action/Plan:   Pt was provided Brilinta card 30 day free. No further needs from CM at this time.   Anticipated DC Date:  04/27/2014   Anticipated DC Plan:  [place]  CM consult  Medication Assistance      Choice offered to / List presented to:             Status of service:  Completed, signed off Medicare Important Message given?  NO (If response is "NO", the following Medicare IM given date fields will be blank) Date Medicare IM given:   Medicare IM given by:   Date Additional Medicare IM given:   Additional Medicare IM given by:    Discharge Disposition:  HOME/SELF CARE  Per UR Regulation:  Reviewed for med. necessity/level of care/duration of stay  If discussed at [place] of Stay Meetings, dates discussed:    Comments:

## 2014-04-27 NOTE — Progress Notes (Signed)
UR Completed Robbi Scurlock Graves-Bigelow, RN,BSN [phone]  

## 2014-04-27 NOTE — Discharge Instructions (Signed)
Coronary Angiogram With Stent, Care After °Refer to this sheet in the next few weeks. These instructions provide you with information on caring for yourself after your procedure. Your health care provider may also give you more specific instructions. Your treatment has been planned according to current medical practices, but problems sometimes occur. Call your health care provider if you have any problems or questions after your procedure.  °WHAT TO EXPECT AFTER THE PROCEDURE  °The insertion site may be tender for a few days after your procedure. °HOME CARE INSTRUCTIONS  °· Take medicines only as directed by your health care provider. Blood thinners may be prescribed after your procedure to improve blood flow through the stent. °· Change any bandages (dressings) as directed by your health care provider.   °· Check your insertion site every day for redness, swelling, or fluid leaking from the insertion.   °· Do not take baths, swim, or use a hot tub until your health care provider approves. You may shower. Pat the insertion area dry. Do not rub the insertion area with a washcloth or towel.   °· Eat a heart-healthy diet. This should include plenty of fresh fruits and vegetables. Meat should be lean cuts. Avoid the following types of food:   °¨ Food that is high in salt.   °¨ Canned or highly processed food.   °¨ Food that is high in saturated fat or sugar.   °¨ Fried food.   °· Make any other lifestyle changes recommended by your health care provider. This may include:   °¨ Not using any tobacco products including cigarettes, chewing tobacco, or electronic cigarettes.  °¨ Managing your weight.   °¨ Getting regular exercise.   °¨ Managing your blood pressure.   °¨ Limiting your alcohol intake.   °¨ Managing other health problems, such as diabetes.   °· If you need an MRI after your heart stent was placed, be sure to tell the health care provider who orders the MRI that you have a heart stent.   °· Keep all follow-up  visits as directed by your health care provider.   °SEEK IMMEDIATE MEDICAL CARE IF:  °· You develop chest pain, shortness of breath, feel faint, or pass out. °· You have bleeding, swelling larger than a walnut, or drainage from the catheter insertion site. °· You develop pain, discoloration, coldness, or severe bruising in the leg or arm that held the catheter. °· You develop bleeding from any other place such as from the bowels. There may be bright red blood in the urine or stools, or it may appear as black, tarry stools. °· You have a fever or chills. °MAKE SURE YOU: °· Understand these instructions. °· Will watch your condition. °· Will get help right away if you are not doing well or get worse. °Document Released: 08/31/2004 Document Revised: 06/28/2013 Document Reviewed: 07/15/2012 °ExitCare® Patient Information ©2015 [employer]. This information is not intended to replace advice given to you by your health care provider. Make sure you discuss any questions you have with your health care provider. ° °

## 2014-04-27 NOTE — Discharge Summary (Signed)
Physician Discharge Summary  Patient ID: Vernon Thomas MRN: [id number] DOB/AGE: 71-May-1947 71 y.o.  Admit date: 04/26/2014 Discharge date: 04/27/2014  Primary Discharge Diagnosis: CAD s/p PTCA and stenting of the mid LAD Secondary Discharge Diagnosis: hyperlipidemia  Significant Diagnostic Studies:  Coronary angiogram 04/26/2014: High-grade subtotally occluded mid LAD, intermediate stenosis of the mid RCA.  Hospital Course: Patient is a 69 year-old fairly active Caucasian male with history of hyperlipidemia, who presented with intermediate coronary syndrome, recent onset of chest discomfort even with minimal exertional activity, previously fairly active and exercises on a regular basis.. Patient had non invasive testing which was abnormal revealing severe septal ischemia. Hence was brought to the cardiac catheterization lab to evaluate coronary anatomy for definitive diagnosis of CAD. He underwent successful PTCA and stenting of the mid LAD with implantation of 2 overlapping 3.0 x 20 and a 3.0 x 12 mm Synergy drug-eluting stent. FFR to intermediate stenosis of the right coronary artery of 50-60%. No significant change in FFR, FFR was 0.96, hence lesion left alone.  Recommendations on discharge: Will need Dual antiplatelet therapy with BRILINTA and ASA 81 mg for at least one year. Continue atorvastatin and metoprolol. Follow up outpatient in 1 week  Discharge Exam: Blood pressure 110/52, pulse 60, temperature 97.6 F (36.4 C), temperature source Oral, resp. rate 20, height 5\' 8"  (1.727 m), weight 79.7 kg (175 lb 11.3 oz), SpO2 96 %.    General appearance: alert, cooperative, appears stated age and no distress Neck: no carotid bruit, no JVD and supple, symmetrical, trachea midline Resp: clear to auscultation bilaterally Cardio: regular rate and rhythm, S1, S2 normal, no murmur, click, rub or gallop Extremities: extremities normal, atraumatic, no cyanosis or edema Pulses: 2+ and symmetric right  radial access site asymptomatic  Labs:   Lab Results  Component Value Date   WBC 9.7 04/27/2014   HGB 13.8 04/27/2014   HCT 39.9 04/27/2014   MCV 94.3 04/27/2014   PLT 163 04/27/2014    Recent Labs Lab 04/27/14 0455  NA 139  K 4.2  CL 108  CO2 26  BUN 18  CREATININE 1.12  CALCIUM 8.4  GLUCOSE 95   No results found for: CKTOTAL, CKMB, CKMBINDEX, TROPONINI  Lipid Panel  No results found for: CHOL, TRIG, HDL, CHOLHDL, VLDL, LDLCALC  EKG 04/27/2014: Bradycardia at a rate of 50 bpm, normal axis, normal intervals, no evidence of ischemia.  No significant change from prior.    Radiology: No results found.    FOLLOW UP PLANS AND APPOINTMENTS Discharge Instructions    Discharge patient    Complete by:  As directed             Medication List    TAKE these medications        aspirin EC 81 MG tablet  Take 81 mg by mouth at bedtime.     atorvastatin 80 MG tablet  Commonly known as:  LIPITOR  Take 80 mg by mouth daily.     FISH OIL PO  Take 1 capsule by mouth at bedtime.     ibuprofen 200 MG tablet  Commonly known as:  ADVIL,MOTRIN  Take 400-600 mg by mouth every 6 (six) hours as needed (pain).     metoprolol tartrate 25 MG tablet  Commonly known as:  LOPRESSOR  Take 25 mg by mouth 2 (two) times daily.     multivitamin with minerals Tabs tablet  Take 1 tablet by mouth daily.     naproxen sodium 220 MG tablet  Commonly known as:  ANAPROX  Take 220 mg by mouth daily as needed (pain). Aleve     nitroGLYCERIN 0.4 MG SL tablet  Commonly known as:  NITROSTAT  Place 0.4 mg under the tongue every 5 (five) minutes as needed for chest pain.     ticagrelor 90 MG Tabs tablet  Commonly known as:  BRILINTA  Take 90 mg by mouth 2 (two) times daily.     traMADol 50 MG tablet  Commonly known as:  ULTRAM  Take 50 mg by mouth every 6 (six) hours as needed (pain).           Follow-up Information    Follow up with Laverda Page, MD On 05/04/2014.   Specialty:   Cardiology   Why:  at 11:30   Contact information:   [address] [place] [address] [phone]        Rachel Bo, NP-C 04/27/2014, 8:25 AM [facility] Pager: [phone] Office: [phone]

## 2014-04-27 NOTE — Progress Notes (Signed)
CARDIAC REHAB PHASE I   Pt feels well and is dressed, walking around room. Declined ambulation in hall. Ed completed with good understanding. Not interested in CRPII as he is an avid exerciser. Understands to walk for next 2 weeks then return to normal ex regimen.  [place], Connell, ACSM 04/27/2014 9:36 AM

## 2014-04-27 NOTE — Progress Notes (Signed)
BB not given for PM due to HR 40-50's.

## 2015-03-01 DIAGNOSIS — Z1389 Encounter for screening for other disorder: Secondary | ICD-10-CM | POA: Diagnosis not present

## 2015-03-01 DIAGNOSIS — Z13820 Encounter for screening for osteoporosis: Secondary | ICD-10-CM | POA: Diagnosis not present

## 2015-03-01 DIAGNOSIS — E785 Hyperlipidemia, unspecified: Secondary | ICD-10-CM | POA: Diagnosis not present

## 2015-03-01 DIAGNOSIS — Z125 Encounter for screening for malignant neoplasm of prostate: Secondary | ICD-10-CM | POA: Diagnosis not present

## 2015-03-01 DIAGNOSIS — Z0000 Encounter for general adult medical examination without abnormal findings: Secondary | ICD-10-CM | POA: Diagnosis not present

## 2015-03-01 DIAGNOSIS — D81818 Other biotin-dependent carboxylase deficiency: Secondary | ICD-10-CM | POA: Diagnosis not present

## 2015-03-08 DIAGNOSIS — E538 Deficiency of other specified B group vitamins: Secondary | ICD-10-CM | POA: Diagnosis not present

## 2015-03-08 DIAGNOSIS — E785 Hyperlipidemia, unspecified: Secondary | ICD-10-CM | POA: Diagnosis not present

## 2015-03-08 DIAGNOSIS — I209 Angina pectoris, unspecified: Secondary | ICD-10-CM | POA: Diagnosis not present

## 2015-03-08 DIAGNOSIS — N182 Chronic kidney disease, stage 2 (mild): Secondary | ICD-10-CM | POA: Diagnosis not present

## 2015-03-23 ENCOUNTER — Other Ambulatory Visit: Payer: Self-pay | Admitting: Neurosurgery

## 2015-03-23 DIAGNOSIS — M5416 Radiculopathy, lumbar region: Secondary | ICD-10-CM

## 2015-03-29 ENCOUNTER — Other Ambulatory Visit: Payer: PPO

## 2015-03-29 DIAGNOSIS — H34821 Venous engorgement, right eye: Secondary | ICD-10-CM | POA: Diagnosis not present

## 2015-04-04 ENCOUNTER — Ambulatory Visit
Admission: RE | Admit: 2015-04-04 | Discharge: 2015-04-04 | Disposition: A | Discharge status: Home / Self Care | Payer: PPO | Source: Ambulatory Visit | Attending: Neurosurgery | Admitting: Neurosurgery

## 2015-04-04 DIAGNOSIS — M5416 Radiculopathy, lumbar region: Secondary | ICD-10-CM

## 2015-04-04 DIAGNOSIS — M4806 Spinal stenosis, lumbar region: Secondary | ICD-10-CM | POA: Diagnosis not present

## 2015-04-04 IMAGING — XA DG EPIDUROGRAM S+I
1 series · 2 of 2 positions shown · non-contrast
Comparison: none

CLINICAL DATA: Lumbar radiculopathy. Low back pain extending into
the lateral aspect of the right lower extremity. Grade 1
anterolisthesis at L4-5. Moderate facet arthropathy at L4-5 and
L5-S1. Moderate central canal stenosis at L4-5 and mild central
canal stenosis at L5-S1.

[Series 1: ortho standard · 2 of 2 slices shown]
[im 1/2]
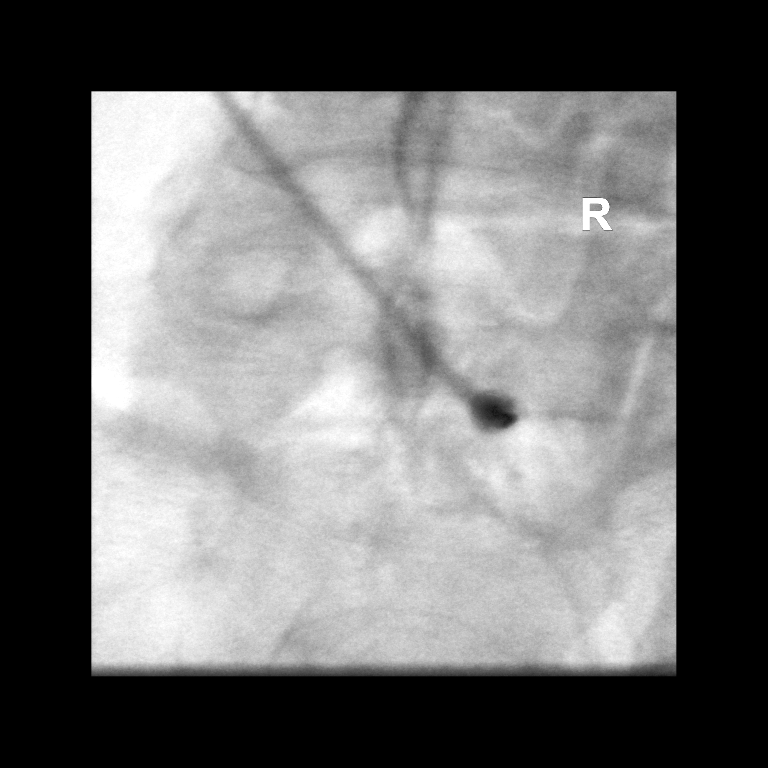
[im 2/2]
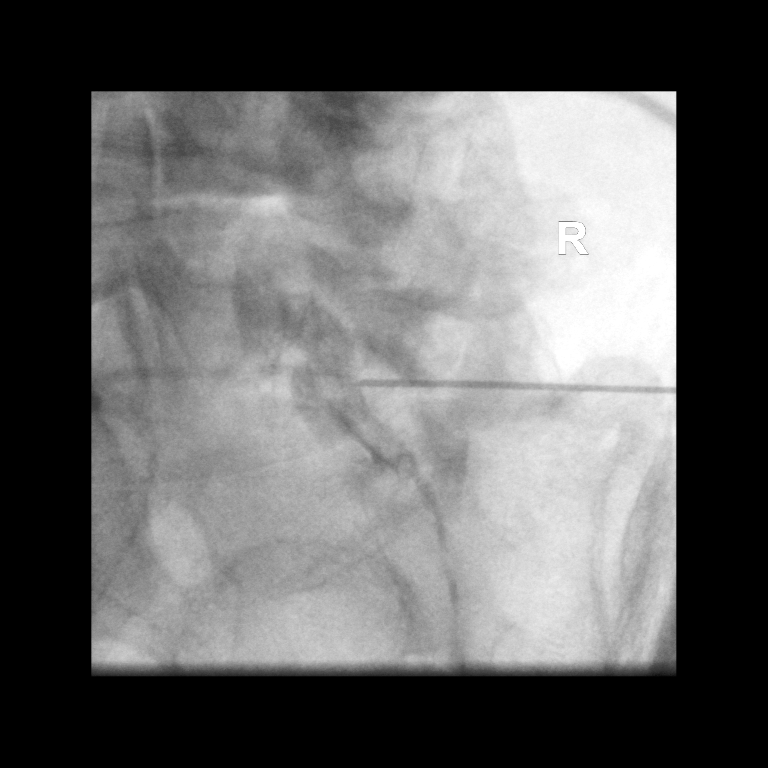

[2 of 2 positions shown; findings below may reference images not displayed]

FLUOROSCOPY TIME:  72.1 uGy*m2

PROCEDURE:
LUMBAR EPIDURAL INJECTION:

An interlaminar approach was performed on the right at L5-S1. The
overlying skin was cleansed and anesthetized. A 20 gauge spinal
needle was advanced using loss-of-resistance technique. Injection of
2cc of Omnipaque 180 confirmed epidural placement. There was no
evidence for intravascular or intrathecal spread of contrast.

I then injected 120 mg of Depo-Medrol and 3ml of 1% lidocaine. The
patient tolerated the procedure without evidence for complication.
The patient was observed for 20 minutes prior to discharge in stable
neurologic condition.
IMPRESSION: Technically successful first interlaminar epidural steroid injection
on the right at L5-S1.

## 2015-04-04 MED ORDER — METHYLPREDNISOLONE ACETATE 40 MG/ML INJ SUSP (RADIOLOG
120.0000 mg | Freq: Once | INTRAMUSCULAR | Status: AC
  Administered 2015-04-04: 120 mg via EPIDURAL

## 2015-04-04 MED ORDER — IOHEXOL 180 MG/ML  SOLN
1.0000 mL | Freq: Once | INTRAMUSCULAR | Status: AC | PRN
  Administered 2015-04-04: 1 mL via EPIDURAL

## 2015-04-04 NOTE — Discharge Instructions (Signed)

## 2015-04-06 DIAGNOSIS — M65841 Other synovitis and tenosynovitis, right hand: Secondary | ICD-10-CM | POA: Diagnosis not present

## 2015-04-06 DIAGNOSIS — M79641 Pain in right hand: Secondary | ICD-10-CM | POA: Diagnosis not present

## 2015-04-06 DIAGNOSIS — M79642 Pain in left hand: Secondary | ICD-10-CM | POA: Diagnosis not present

## 2015-04-06 DIAGNOSIS — M65842 Other synovitis and tenosynovitis, left hand: Secondary | ICD-10-CM | POA: Diagnosis not present

## 2015-05-24 DIAGNOSIS — I25119 Atherosclerotic heart disease of native coronary artery with unspecified angina pectoris: Secondary | ICD-10-CM | POA: Diagnosis not present

## 2015-05-24 DIAGNOSIS — E7800 Pure hypercholesterolemia, unspecified: Secondary | ICD-10-CM | POA: Diagnosis not present

## 2015-05-24 DIAGNOSIS — Z9861 Coronary angioplasty status: Secondary | ICD-10-CM | POA: Diagnosis not present

## 2015-06-02 ENCOUNTER — Other Ambulatory Visit: Payer: Self-pay | Admitting: Neurosurgery

## 2015-06-02 DIAGNOSIS — M5416 Radiculopathy, lumbar region: Secondary | ICD-10-CM

## 2015-06-02 DIAGNOSIS — R0602 Shortness of breath: Secondary | ICD-10-CM | POA: Diagnosis not present

## 2015-06-08 ENCOUNTER — Ambulatory Visit
Admission: RE | Admit: 2015-06-08 | Discharge: 2015-06-08 | Disposition: A | Discharge status: Home / Self Care | Payer: PPO | Source: Ambulatory Visit | Attending: Neurosurgery | Admitting: Neurosurgery

## 2015-06-08 DIAGNOSIS — M5416 Radiculopathy, lumbar region: Secondary | ICD-10-CM

## 2015-06-08 DIAGNOSIS — M5126 Other intervertebral disc displacement, lumbar region: Secondary | ICD-10-CM | POA: Diagnosis not present

## 2015-06-08 MED ORDER — METHYLPREDNISOLONE ACETATE 40 MG/ML INJ SUSP (RADIOLOG
120.0000 mg | Freq: Once | INTRAMUSCULAR | Status: AC
  Administered 2015-06-08: 120 mg via EPIDURAL

## 2015-06-08 MED ORDER — IOHEXOL 180 MG/ML  SOLN
1.0000 mL | Freq: Once | INTRAMUSCULAR | Status: AC | PRN
  Administered 2015-06-08: 1 mL via EPIDURAL

## 2015-06-15 ENCOUNTER — Other Ambulatory Visit: Payer: PPO

## 2015-08-08 DIAGNOSIS — L570 Actinic keratosis: Secondary | ICD-10-CM | POA: Diagnosis not present

## 2015-08-08 DIAGNOSIS — Z85828 Personal history of other malignant neoplasm of skin: Secondary | ICD-10-CM | POA: Diagnosis not present

## 2015-08-08 DIAGNOSIS — L821 Other seborrheic keratosis: Secondary | ICD-10-CM | POA: Diagnosis not present

## 2015-09-06 DIAGNOSIS — S61217A Laceration without foreign body of left little finger without damage to nail, initial encounter: Secondary | ICD-10-CM | POA: Diagnosis not present

## 2015-09-13 DIAGNOSIS — S61219D Laceration without foreign body of unspecified finger without damage to nail, subsequent encounter: Secondary | ICD-10-CM | POA: Diagnosis not present

## 2015-10-09 ENCOUNTER — Other Ambulatory Visit: Payer: Self-pay | Admitting: Neurosurgery

## 2015-10-09 DIAGNOSIS — M5416 Radiculopathy, lumbar region: Secondary | ICD-10-CM

## 2015-10-27 ENCOUNTER — Ambulatory Visit
Admission: RE | Admit: 2015-10-27 | Discharge: 2015-10-27 | Disposition: A | Discharge status: Home / Self Care | Payer: PPO | Source: Ambulatory Visit | Attending: Neurosurgery | Admitting: Neurosurgery

## 2015-10-27 DIAGNOSIS — M5416 Radiculopathy, lumbar region: Secondary | ICD-10-CM

## 2015-10-27 DIAGNOSIS — M5126 Other intervertebral disc displacement, lumbar region: Secondary | ICD-10-CM | POA: Diagnosis not present

## 2015-10-27 MED ORDER — METHYLPREDNISOLONE ACETATE 40 MG/ML INJ SUSP (RADIOLOG
120.0000 mg | Freq: Once | INTRAMUSCULAR | Status: AC
  Administered 2015-10-27: 120 mg via EPIDURAL

## 2015-10-27 MED ORDER — IOPAMIDOL (ISOVUE-M 200) INJECTION 41%
1.0000 mL | Freq: Once | INTRAMUSCULAR | Status: AC
  Administered 2015-10-27: 1 mL via EPIDURAL

## 2015-11-22 DIAGNOSIS — E7800 Pure hypercholesterolemia, unspecified: Secondary | ICD-10-CM | POA: Diagnosis not present

## 2015-11-22 DIAGNOSIS — Z9861 Coronary angioplasty status: Secondary | ICD-10-CM | POA: Diagnosis not present

## 2015-11-22 DIAGNOSIS — I25119 Atherosclerotic heart disease of native coronary artery with unspecified angina pectoris: Secondary | ICD-10-CM | POA: Diagnosis not present

## 2016-02-14 DIAGNOSIS — M5431 Sciatica, right side: Secondary | ICD-10-CM | POA: Diagnosis not present

## 2016-02-14 DIAGNOSIS — M9903 Segmental and somatic dysfunction of lumbar region: Secondary | ICD-10-CM | POA: Diagnosis not present

## 2016-02-14 DIAGNOSIS — M9905 Segmental and somatic dysfunction of pelvic region: Secondary | ICD-10-CM | POA: Diagnosis not present

## 2016-02-14 DIAGNOSIS — M9902 Segmental and somatic dysfunction of thoracic region: Secondary | ICD-10-CM | POA: Diagnosis not present

## 2016-02-15 DIAGNOSIS — L821 Other seborrheic keratosis: Secondary | ICD-10-CM | POA: Diagnosis not present

## 2016-02-15 DIAGNOSIS — D1801 Hemangioma of skin and subcutaneous tissue: Secondary | ICD-10-CM | POA: Diagnosis not present

## 2016-02-15 DIAGNOSIS — Z85828 Personal history of other malignant neoplasm of skin: Secondary | ICD-10-CM | POA: Diagnosis not present

## 2016-02-15 DIAGNOSIS — L570 Actinic keratosis: Secondary | ICD-10-CM | POA: Diagnosis not present

## 2016-02-16 DIAGNOSIS — M9903 Segmental and somatic dysfunction of lumbar region: Secondary | ICD-10-CM | POA: Diagnosis not present

## 2016-02-16 DIAGNOSIS — M9902 Segmental and somatic dysfunction of thoracic region: Secondary | ICD-10-CM | POA: Diagnosis not present

## 2016-02-16 DIAGNOSIS — M9905 Segmental and somatic dysfunction of pelvic region: Secondary | ICD-10-CM | POA: Diagnosis not present

## 2016-02-16 DIAGNOSIS — M5431 Sciatica, right side: Secondary | ICD-10-CM | POA: Diagnosis not present

## 2016-03-07 DIAGNOSIS — E785 Hyperlipidemia, unspecified: Secondary | ICD-10-CM | POA: Diagnosis not present

## 2016-03-07 DIAGNOSIS — Z0000 Encounter for general adult medical examination without abnormal findings: Secondary | ICD-10-CM | POA: Diagnosis not present

## 2016-03-14 DIAGNOSIS — Z0000 Encounter for general adult medical examination without abnormal findings: Secondary | ICD-10-CM | POA: Diagnosis not present

## 2016-03-14 DIAGNOSIS — E538 Deficiency of other specified B group vitamins: Secondary | ICD-10-CM | POA: Diagnosis not present

## 2016-03-14 DIAGNOSIS — E785 Hyperlipidemia, unspecified: Secondary | ICD-10-CM | POA: Diagnosis not present

## 2016-03-14 DIAGNOSIS — I2510 Atherosclerotic heart disease of native coronary artery without angina pectoris: Secondary | ICD-10-CM | POA: Diagnosis not present

## 2016-03-19 DIAGNOSIS — H34821 Venous engorgement, right eye: Secondary | ICD-10-CM | POA: Diagnosis not present

## 2016-04-10 DIAGNOSIS — G894 Chronic pain syndrome: Secondary | ICD-10-CM | POA: Diagnosis not present

## 2016-05-30 DIAGNOSIS — E7800 Pure hypercholesterolemia, unspecified: Secondary | ICD-10-CM | POA: Diagnosis not present

## 2016-05-30 DIAGNOSIS — Z9861 Coronary angioplasty status: Secondary | ICD-10-CM | POA: Diagnosis not present

## 2016-05-30 DIAGNOSIS — I25119 Atherosclerotic heart disease of native coronary artery with unspecified angina pectoris: Secondary | ICD-10-CM | POA: Diagnosis not present

## 2016-06-14 DIAGNOSIS — I2510 Atherosclerotic heart disease of native coronary artery without angina pectoris: Secondary | ICD-10-CM | POA: Diagnosis not present

## 2016-08-15 DIAGNOSIS — L821 Other seborrheic keratosis: Secondary | ICD-10-CM | POA: Diagnosis not present

## 2016-08-15 DIAGNOSIS — Z85828 Personal history of other malignant neoplasm of skin: Secondary | ICD-10-CM | POA: Diagnosis not present

## 2016-08-15 DIAGNOSIS — L570 Actinic keratosis: Secondary | ICD-10-CM | POA: Diagnosis not present

## 2016-08-15 DIAGNOSIS — L814 Other melanin hyperpigmentation: Secondary | ICD-10-CM | POA: Diagnosis not present

## 2017-03-10 DIAGNOSIS — Z0000 Encounter for general adult medical examination without abnormal findings: Secondary | ICD-10-CM | POA: Diagnosis not present

## 2017-03-14 DIAGNOSIS — Z0000 Encounter for general adult medical examination without abnormal findings: Secondary | ICD-10-CM | POA: Diagnosis not present

## 2017-03-14 DIAGNOSIS — E538 Deficiency of other specified B group vitamins: Secondary | ICD-10-CM | POA: Diagnosis not present

## 2017-03-14 DIAGNOSIS — I2510 Atherosclerotic heart disease of native coronary artery without angina pectoris: Secondary | ICD-10-CM | POA: Diagnosis not present

## 2017-03-19 DIAGNOSIS — Z0000 Encounter for general adult medical examination without abnormal findings: Secondary | ICD-10-CM | POA: Diagnosis not present

## 2017-03-19 DIAGNOSIS — E538 Deficiency of other specified B group vitamins: Secondary | ICD-10-CM | POA: Diagnosis not present

## 2017-03-19 DIAGNOSIS — G8929 Other chronic pain: Secondary | ICD-10-CM | POA: Diagnosis not present

## 2017-03-19 DIAGNOSIS — I2510 Atherosclerotic heart disease of native coronary artery without angina pectoris: Secondary | ICD-10-CM | POA: Diagnosis not present

## 2017-03-19 DIAGNOSIS — E785 Hyperlipidemia, unspecified: Secondary | ICD-10-CM | POA: Diagnosis not present

## 2017-03-19 DIAGNOSIS — M5441 Lumbago with sciatica, right side: Secondary | ICD-10-CM | POA: Diagnosis not present

## 2017-05-29 DIAGNOSIS — I2510 Atherosclerotic heart disease of native coronary artery without angina pectoris: Secondary | ICD-10-CM | POA: Diagnosis not present

## 2017-05-29 DIAGNOSIS — E7800 Pure hypercholesterolemia, unspecified: Secondary | ICD-10-CM | POA: Diagnosis not present

## 2017-05-29 DIAGNOSIS — Z9861 Coronary angioplasty status: Secondary | ICD-10-CM | POA: Diagnosis not present

## 2017-08-19 DIAGNOSIS — L570 Actinic keratosis: Secondary | ICD-10-CM | POA: Diagnosis not present

## 2017-08-19 DIAGNOSIS — L821 Other seborrheic keratosis: Secondary | ICD-10-CM | POA: Diagnosis not present

## 2017-08-19 DIAGNOSIS — Z85828 Personal history of other malignant neoplasm of skin: Secondary | ICD-10-CM | POA: Diagnosis not present

## 2017-11-06 DIAGNOSIS — L570 Actinic keratosis: Secondary | ICD-10-CM | POA: Diagnosis not present

## 2017-11-06 DIAGNOSIS — D0439 Carcinoma in situ of skin of other parts of face: Secondary | ICD-10-CM | POA: Diagnosis not present

## 2017-11-06 DIAGNOSIS — Z85828 Personal history of other malignant neoplasm of skin: Secondary | ICD-10-CM | POA: Diagnosis not present

## 2017-11-06 DIAGNOSIS — D485 Neoplasm of uncertain behavior of skin: Secondary | ICD-10-CM | POA: Diagnosis not present

## 2017-12-12 DIAGNOSIS — Z23 Encounter for immunization: Secondary | ICD-10-CM | POA: Diagnosis not present

## 2018-03-13 DIAGNOSIS — Z125 Encounter for screening for malignant neoplasm of prostate: Secondary | ICD-10-CM | POA: Diagnosis not present

## 2018-03-13 DIAGNOSIS — Z0000 Encounter for general adult medical examination without abnormal findings: Secondary | ICD-10-CM | POA: Diagnosis not present

## 2018-03-13 DIAGNOSIS — N182 Chronic kidney disease, stage 2 (mild): Secondary | ICD-10-CM | POA: Diagnosis not present

## 2018-03-27 DIAGNOSIS — Z0000 Encounter for general adult medical examination without abnormal findings: Secondary | ICD-10-CM | POA: Diagnosis not present

## 2018-03-27 DIAGNOSIS — E785 Hyperlipidemia, unspecified: Secondary | ICD-10-CM | POA: Diagnosis not present

## 2018-03-27 DIAGNOSIS — E538 Deficiency of other specified B group vitamins: Secondary | ICD-10-CM | POA: Diagnosis not present

## 2018-03-27 DIAGNOSIS — N182 Chronic kidney disease, stage 2 (mild): Secondary | ICD-10-CM | POA: Diagnosis not present

## 2018-03-27 DIAGNOSIS — I2510 Atherosclerotic heart disease of native coronary artery without angina pectoris: Secondary | ICD-10-CM | POA: Diagnosis not present

## 2018-03-31 DIAGNOSIS — L570 Actinic keratosis: Secondary | ICD-10-CM | POA: Diagnosis not present

## 2018-03-31 DIAGNOSIS — D485 Neoplasm of uncertain behavior of skin: Secondary | ICD-10-CM | POA: Diagnosis not present

## 2018-03-31 DIAGNOSIS — Z85828 Personal history of other malignant neoplasm of skin: Secondary | ICD-10-CM | POA: Diagnosis not present

## 2018-03-31 DIAGNOSIS — C44519 Basal cell carcinoma of skin of other part of trunk: Secondary | ICD-10-CM | POA: Diagnosis not present

## 2018-03-31 DIAGNOSIS — C44619 Basal cell carcinoma of skin of left upper limb, including shoulder: Secondary | ICD-10-CM | POA: Diagnosis not present

## 2018-05-22 DIAGNOSIS — R972 Elevated prostate specific antigen [PSA]: Secondary | ICD-10-CM | POA: Diagnosis not present

## 2018-05-28 ENCOUNTER — Ambulatory Visit: Payer: Self-pay | Admitting: Cardiology

## 2018-07-03 ENCOUNTER — Ambulatory Visit: Payer: Self-pay | Admitting: Cardiology

## 2018-07-17 ENCOUNTER — Other Ambulatory Visit: Payer: Self-pay | Admitting: Cardiology

## 2018-08-04 DIAGNOSIS — D485 Neoplasm of uncertain behavior of skin: Secondary | ICD-10-CM | POA: Diagnosis not present

## 2018-08-04 DIAGNOSIS — L570 Actinic keratosis: Secondary | ICD-10-CM | POA: Diagnosis not present

## 2018-08-04 DIAGNOSIS — D1801 Hemangioma of skin and subcutaneous tissue: Secondary | ICD-10-CM | POA: Diagnosis not present

## 2018-08-04 DIAGNOSIS — C44619 Basal cell carcinoma of skin of left upper limb, including shoulder: Secondary | ICD-10-CM | POA: Diagnosis not present

## 2018-08-04 DIAGNOSIS — Z85828 Personal history of other malignant neoplasm of skin: Secondary | ICD-10-CM | POA: Diagnosis not present

## 2018-08-04 DIAGNOSIS — L821 Other seborrheic keratosis: Secondary | ICD-10-CM | POA: Diagnosis not present

## 2018-09-25 DIAGNOSIS — R972 Elevated prostate specific antigen [PSA]: Secondary | ICD-10-CM | POA: Diagnosis not present

## 2018-11-03 DIAGNOSIS — C61 Malignant neoplasm of prostate: Secondary | ICD-10-CM | POA: Diagnosis not present

## 2018-11-04 DIAGNOSIS — C61 Malignant neoplasm of prostate: Secondary | ICD-10-CM | POA: Diagnosis not present

## 2018-11-04 DIAGNOSIS — Z23 Encounter for immunization: Secondary | ICD-10-CM | POA: Diagnosis not present

## 2018-11-05 ENCOUNTER — Encounter: Payer: Self-pay | Admitting: *Deleted

## 2018-11-09 ENCOUNTER — Encounter: Payer: Self-pay | Admitting: Radiation Oncology

## 2018-11-09 NOTE — Progress Notes (Signed)
GU Location of Tumor / Histology: prostatic adenocarcinoma  If Prostate Cancer, Gleason Score is (3 + 4) and PSA is (4.7) on 03/13/2018. Prostate volume: 29.3. Oncotype GPS + NCCN 37 GPS , intermediate risk.  02/2017 PSA 4.1  Vernon Thomas was followed for prostate screening up until 2002 by Dr. Gaynelle Arabian. At that time, PSA was 0.8. His PSA in 2019 was 4.1.  Biopsies of prostate (if applicable) revealed:    Past/Anticipated interventions by urology, if any: prostate biopsy, referral to Dr. Tammi Klippel for consideration of brachytherapy  Past/Anticipated interventions by medical oncology, if any: no  Weight changes, if any: no  Bowel/Bladder complaints, if any: IPSS 8. SHIM 25. Still sexually active. Nocturia x 2.  Denies dysuria or hematuria. Denies urinary leakage or incontinence.   Nausea/Vomiting, if any: no  Pain issues, if any:  Denies  SAFETY ISSUES:  Prior radiation? no  Pacemaker/ICD? no  Possible current pregnancy? no, male patient  Is the patient on methotrexate? no  Current Complaints / other details:  73 year old male. Married with two sons. Retired Psychologist, sport and exercise. NKDA.

## 2018-11-10 ENCOUNTER — Ambulatory Visit
Admission: RE | Admit: 2018-11-10 | Discharge: 2018-11-10 | Disposition: A | Discharge status: Home / Self Care | Payer: PPO | Source: Ambulatory Visit | Attending: Radiation Oncology | Admitting: Radiation Oncology

## 2018-11-10 ENCOUNTER — Other Ambulatory Visit: Payer: Self-pay

## 2018-11-10 ENCOUNTER — Encounter: Payer: Self-pay | Admitting: Radiation Oncology

## 2018-11-10 ENCOUNTER — Encounter: Payer: Self-pay | Admitting: Medical Oncology

## 2018-11-10 VITALS — Ht 68.0 in | Wt 175.0 lb

## 2018-11-10 DIAGNOSIS — C61 Malignant neoplasm of prostate: Secondary | ICD-10-CM | POA: Diagnosis not present

## 2018-11-10 DIAGNOSIS — R972 Elevated prostate specific antigen [PSA]: Secondary | ICD-10-CM | POA: Diagnosis not present

## 2018-11-10 HISTORY — DX: Malignant neoplasm of prostate: C61

## 2018-11-10 NOTE — Progress Notes (Signed)
Radiation Oncology         [phone] ________________________________  Initial outpatient Consultation - Conducted via Telephone due to current COVID-19 concerns for limiting patient exposure  Name: Vernon Thomas MRN: [id number]  Date: 11/10/2018  DOB: [birth year]  CC:Kim, Jeneen Rinks, MD  Franchot Gallo, MD   REFERRING PHYSICIAN: Franchot Gallo, MD  DIAGNOSIS: 73 y.o. gentleman with Stage T1c adenocarcinoma of the prostate with Gleason score of 3+4, and PSA of 4.66.    ICD-10-CM   1. Malignant neoplasm of prostate ([place])  C61     HISTORY OF PRESENT ILLNESS: Vernon Thomas is a 73 y.o. male with a diagnosis of prostate cancer. He was noted to have an elevated PSA of 4.7 by his primary care physician, Dr. Maudie Mercury.  Accordingly, he requested referral for evaluation in urology by Dr. Diona Fanti on 05/22/2018,  digital rectal examination performed at that time was normal. The patient proceeded to transrectal ultrasound with 12 biopsies of the prostate on 09/25/2018 and a repeat PSA performed that day remained elevated at 4.66.  The prostate volume measured 29.35 cc.  Out of 12 core biopsies, 3 were positive.  The maximum Gleason score was 3+4, and this was seen in left base lateral. Gleason 3+3 was seen in left apex lateral and right apex lateral.  Oncotype DX was obtained and his genomic prostate score was 37, consistent with a bit more aggressive prostate cancer than indicated on biopsy. This score predicts a 56% chance of adverse pathology (greater than Gleason 4+3), 1% chance of cancer death within 10 years, and a 9% chance of metastasis within 10 years.  The patient reviewed the biopsy and Oncotype DX results with his urologist and he has kindly been referred today for discussion of potential radiation treatment options.  PREVIOUS RADIATION THERAPY: No  PAST MEDICAL HISTORY:  Past Medical History:  Diagnosis Date  . No pertinent past medical history   . Osteoarthritis   .  Prostate cancer ([place])       PAST SURGICAL HISTORY: Past Surgical History:  Procedure Laterality Date  . ACHILLES TENDON SURGERY  05/23/2011   Procedure: ACHILLES TENDON REPAIR;  Surgeon: Ninetta Lights, MD;  Location: [place];  Service: Orthopedics;  Laterality: Left;  left ankle repair rupture achilles tendon with end to end asastomisis and distal reattachment  . COLONOSCOPY    . DUPUYTREN / PALMAR FASCIOTOMY     lt   . EYE SURGERY     lazer torn retina rt eye  . KNEE ARTHROSCOPY     both  . LEFT HEART CATHETERIZATION WITH CORONARY ANGIOGRAM N/A 04/26/2014   Procedure: LEFT HEART CATHETERIZATION WITH CORONARY ANGIOGRAM;  Surgeon: Laverda Page, MD;  Location: [facility] CATH LAB;  Service: Cardiovascular;  Laterality: N/A;  . SHOULDER ARTHROSCOPY     rt  . TRIGGER FINGER RELEASE     multiple    FAMILY HISTORY:  Family History  Problem Relation Age of Onset  . Diabetes Mother   . Colon cancer Neg Hx   . Rectal cancer Neg Hx   . Stomach cancer Neg Hx   . Prostate cancer Neg Hx     SOCIAL HISTORY:  Social History   Socioeconomic History  . Marital status: Married    Spouse name: Not on file  . Number of children: 2  . Years of education: Not on file  . Highest education level: Not on file  Occupational History    Comment: retired Psychologist, sport and exercise  Social  Needs  . Financial resource strain: Not on file  . Food insecurity    Worry: Not on file    Inability: Not on file  . Transportation needs    Medical: Not on file    Non-medical: Not on file  Tobacco Use  . Smoking status: Never Smoker  . Smokeless tobacco: Never Used  Substance and Sexual Activity  . Alcohol use: No    Alcohol/week: 0.0 standard drinks  . Drug use: No  . Sexual activity: Yes  Lifestyle  . Physical activity    Days per week: Not on file    Minutes per session: Not on file  . Stress: Not on file  Relationships  . Social Herbalist on phone: Not on file    Gets together:  Not on file    Attends religious service: Not on file    Active member of club or organization: Not on file    Attends meetings of clubs or organizations: Not on file    Relationship status: Not on file  . Intimate partner violence    Fear of current or ex partner: Not on file    Emotionally abused: Not on file    Physically abused: Not on file    Forced sexual activity: Not on file  Other Topics Concern  . Not on file  Social History Narrative  . Not on file    ALLERGIES: Patient has no known allergies.  MEDICATIONS:  Current Outpatient Medications  Medication Sig Dispense Refill  . aspirin EC 81 MG tablet Take 81 mg by mouth at bedtime.    Marland Kitchen ibuprofen (ADVIL,MOTRIN) 200 MG tablet Take 400-600 mg by mouth every 6 (six) hours as needed (pain).     . Multiple Vitamin (MULTIVITAMIN WITH MINERALS) TABS tablet Take 1 tablet by mouth daily.    . naproxen sodium (ANAPROX) 220 MG tablet Take 220 mg by mouth daily as needed (pain). Aleve    . nitroGLYCERIN (NITROSTAT) 0.4 MG SL tablet Place 0.4 mg under the tongue every 5 (five) minutes as needed for chest pain.    . Omega-3 Fatty Acids (FISH OIL PO) Take 1 capsule by mouth at bedtime.    . rosuvastatin (CRESTOR) 20 MG tablet TAKE ONE TABLET BY MOUTH ONE TIME DAILY  90 tablet 2  . traMADol (ULTRAM) 50 MG tablet Take 50 mg by mouth every 6 (six) hours as needed (pain).      No current facility-administered medications for this encounter.     REVIEW OF SYSTEMS:  On review of systems, the patient reports that he is doing well overall. He denies any chest pain, shortness of breath, cough, fevers, chills, night sweats, unintended weight changes. He denies any bowel disturbances, and denies abdominal pain, nausea or vomiting. He denies any new musculoskeletal or joint aches or pains. His IPSS was 8, indicating moderate urinary symptoms. He reports nocturia x2, and he denies dysuria or hematuria and urinary leakage or incontinence. His SHIM was 25,  indicating he does not have erectile dysfunction. He remains sexually active. A complete review of systems is obtained and is otherwise negative.    PHYSICAL EXAM:  Wt Readings from Last 3 Encounters:  11/10/18 175 lb (79.4 kg)  04/27/14 175 lb 11.3 oz (79.7 kg)  03/25/14 181 lb (82.1 kg)   Temp Readings from Last 3 Encounters:  04/27/14 97.6 F (36.4 C) (Oral)  03/25/14 (!) 96.5 F (35.8 C) (Tympanic)  05/23/11 97.4 F (36.3 C)  BP Readings from Last 3 Encounters:  10/27/15 122/79  06/08/15 121/76  04/04/15 (!) 148/88   Pulse Readings from Last 3 Encounters:  10/27/15 (!) 56  06/08/15 (!) 54  04/04/15 (!) 58   Pain Assessment Pain Score: 0-No pain/10  Unable to assess due to telephone consult visit format.  KPS = 90  100 - Normal; no complaints; no evidence of disease. 90   - Able to carry on normal activity; minor signs or symptoms of disease. 80   - Normal activity with effort; some signs or symptoms of disease. 56   - Cares for self; unable to carry on normal activity or to do active work. 60   - Requires occasional assistance, but is able to care for most of his personal needs. 50   - Requires considerable assistance and frequent medical care. 62   - Disabled; requires special care and assistance. 40   - Severely disabled; hospital admission is indicated although death not imminent. 21   - Very sick; hospital admission necessary; active supportive treatment necessary. 10   - Moribund; fatal processes progressing rapidly. 0     - Dead  Karnofsky DA, Abelmann [place], Craver LS and Burchenal [facility] [phone]) The use of the nitrogen mustards in the palliative treatment of carcinoma: with particular reference to bronchogenic carcinoma Cancer 1 [phone]  LABORATORY DATA:  Lab Results  Component Value Date   WBC 9.7 04/27/2014   HGB 13.8 04/27/2014   HCT 39.9 04/27/2014   MCV 94.3 04/27/2014   PLT 163 04/27/2014   Lab Results  Component Value Date   NA 139 04/27/2014   K  4.2 04/27/2014   CL 108 04/27/2014   CO2 26 04/27/2014   No results found for: ALT, AST, GGT, ALKPHOS, BILITOT   RADIOGRAPHY: Dg Chest 2 View  Result Date: 11/13/2018 CLINICAL DATA:  Preop prostate surgery. EXAM: CHEST - 2 VIEW COMPARISON:  None. FINDINGS: Lungs are adequately inflated and otherwise clear. Cardiomediastinal silhouette is within normal. There are degenerative changes of the spine. IMPRESSION: No active cardiopulmonary disease. Electronically Signed   By: Marin Olp M.D.   On: 11/13/2018 14:45      IMPRESSION/PLAN:  This visit was conducted via Telephone to spare the patient unnecessary potential exposure in the healthcare setting during the current COVID-19 pandemic.  1. 73 y.o. gentleman with Stage T1c adenocarcinoma of the prostate with Gleason Score of 3+4, and PSA of 4.66. We discussed the patient's workup and outlined the nature of prostate cancer in this setting. The patient's T stage, Gleason's score, and PSA put him into the favorable intermediate risk group but Oncotype DX score indicates a slightly more aggressive cancer. Accordingly, he is eligible for a variety of potential treatment options including brachytherapy, 5.5 weeks of external radiation, or prostatectomy. We discussed the available radiation techniques, and focused on the details of logistics and delivery. He focused most of his questions on brachytherapy. We discussed and outlined the risks, benefits, short and long-term effects associated with this type of radiotherapy. We also discussed the role of SpaceOAR gel in reducing the rectal toxicity associated with radiotherapy.  He was encouraged to ask questions that were answered to his stated satisfaction.  He appears to have a good understanding of his disease and our treatment recommendations which are of curative intent.  At the end of the conversation the patient is interested in moving forward with brachytherapy and use of SpaceOAR gel to reduce rectal  toxicity from radiotherapy.  We  will share our discussion with Dr. Diona Fanti and move forward with scheduling his CT [facility] planning appointment in the near future.  The patient will be contacted by Romie Jumper in our office who will be working closely with him to coordinate OR scheduling and pre and post procedure appointments.  We will contact the pharmaceutical rep to ensure that Salix is available at the time of procedure.  He will have a prostate MRI following his post-seed CT SIM to confirm appropriate distribution of the [place].  Given current concerns for patient exposure during the COVID-19 pandemic, this encounter was conducted via telephone. The patient was notified in advance and was offered a MyChart meeting to allow for face to face communication but unfortunately reported that he did not have the appropriate resources/technology to support such a visit and instead preferred to proceed with telephone consult. The patient has given verbal consent for this type of encounter. The time spent during this encounter was 40 minutes. The attendants for this meeting include Tyler Pita MD, Ashlyn Bruning PA-C, [place], and patient Erice. During the encounter, Tyler Pita MD, Ashlyn Bruning PA-C, and scribe, Wilburn Mylar were located at [place].  Patient Jarl was located at home.    Nicholos Johns, PA-C    Tyler Pita, MD  [facility] Direct Dial: [phone]  Fax: [phone] [email or web address]  Skype  LinkedIn  This document serves as a record of services personally performed by Tyler Pita, MD and Freeman Caldron, PA-C. It was created on their behalf by Wilburn Mylar, a trained medical scribe. The creation of this record is based on the scribe's personal observations and the provider's statements to them. This document has been checked and approved by the attending  provider.

## 2018-11-10 NOTE — Progress Notes (Signed)
See progress note under physician encounter. 

## 2018-11-11 ENCOUNTER — Telehealth: Payer: Self-pay | Admitting: *Deleted

## 2018-11-11 NOTE — Telephone Encounter (Signed)
CALLED PATIENT TO ASK QUESTIONS, SPOKE WITH PATIENT 

## 2018-11-12 ENCOUNTER — Telehealth: Payer: Self-pay | Admitting: *Deleted

## 2018-11-12 NOTE — Telephone Encounter (Signed)
Called patient to inform of pre-seed scan for 11-13-18 - arrival time- 8:15 am, spoke with patient and he is aware of these appts.

## 2018-11-13 ENCOUNTER — Encounter (HOSPITAL_COMMUNITY)
Admission: RE | Admit: 2018-11-13 | Discharge: 2018-11-13 | Disposition: A | Discharge status: Home / Self Care | Payer: PPO | Source: Ambulatory Visit | Attending: Urology | Admitting: Urology

## 2018-11-13 ENCOUNTER — Other Ambulatory Visit: Payer: Self-pay

## 2018-11-13 ENCOUNTER — Ambulatory Visit
Admission: RE | Admit: 2018-11-13 | Discharge: 2018-11-13 | Disposition: A | Discharge status: Home / Self Care | Payer: PPO | Source: Ambulatory Visit | Attending: Radiation Oncology | Admitting: Radiation Oncology

## 2018-11-13 ENCOUNTER — Encounter: Payer: Self-pay | Admitting: Medical Oncology

## 2018-11-13 ENCOUNTER — Ambulatory Visit
Admission: RE | Admit: 2018-11-13 | Discharge: 2018-11-13 | Disposition: A | Discharge status: Home / Self Care | Payer: PPO | Source: Ambulatory Visit | Attending: Urology | Admitting: Urology

## 2018-11-13 ENCOUNTER — Ambulatory Visit (HOSPITAL_COMMUNITY)
Admission: RE | Admit: 2018-11-13 | Discharge: 2018-11-13 | Disposition: A | Discharge status: Home / Self Care | Payer: PPO | Source: Ambulatory Visit | Attending: Urology | Admitting: Urology

## 2018-11-13 DIAGNOSIS — C61 Malignant neoplasm of prostate: Secondary | ICD-10-CM | POA: Diagnosis not present

## 2018-11-13 DIAGNOSIS — Z01818 Encounter for other preprocedural examination: Secondary | ICD-10-CM | POA: Insufficient documentation

## 2018-11-13 NOTE — Progress Notes (Signed)
  Radiation Oncology         [phone] ________________________________  Name: Vernon Thomas MRN: [id number]  Date: 11/13/2018  DOB: 01-12-1946  SIMULATION AND TREATMENT PLANNING NOTE PUBIC ARCH STUDY  CC:Kim, Jeneen Rinks, MD  Franchot Gallo, MD  DIAGNOSIS: 73 y.o. gentleman with Stage T1c adenocarcinoma of the prostate with Gleason score of 3+4, and PSA of 4.66.     ICD-10-CM   1. Malignant neoplasm of prostate ([place])  C61     COMPLEX SIMULATION:  The patient presented today for evaluation for possible prostate seed implant. He was brought to the radiation planning suite and placed supine on the CT couch. A 3-dimensional image study set was obtained in upload to the planning computer. There, on each axial slice, I contoured the prostate gland. Then, using three-dimensional radiation planning tools I reconstructed the prostate in view of the structures from the transperineal needle pathway to assess for possible pubic arch interference. In doing so, I did not appreciate any pubic arch interference. Also, the patient's prostate volume was estimated based on the drawn structure. The volume was 29 cc.  Given the pubic arch appearance and prostate volume, patient remains a good candidate to proceed with prostate seed implant. Today, he freely provided informed written consent to proceed.    PLAN: The patient will undergo prostate seed implant.   ________________________________  Sheral Apley. Tammi Klippel, M.D.   This document serves as a record of services personally performed by Tyler Pita, MD. It was created on his behalf by Wilburn Mylar, a trained medical scribe. The creation of this record is based on the scribe's personal observations and the provider's statements to them. This document has been checked and approved by the attending provider.

## 2018-11-17 ENCOUNTER — Other Ambulatory Visit: Payer: Self-pay | Admitting: Urology

## 2018-11-24 ENCOUNTER — Other Ambulatory Visit: Payer: Self-pay | Admitting: Urology

## 2018-11-24 DIAGNOSIS — C61 Malignant neoplasm of prostate: Secondary | ICD-10-CM

## 2018-11-27 ENCOUNTER — Telehealth: Payer: Self-pay | Admitting: *Deleted

## 2018-11-27 ENCOUNTER — Encounter (HOSPITAL_BASED_OUTPATIENT_CLINIC_OR_DEPARTMENT_OTHER): Payer: Self-pay | Admitting: *Deleted

## 2018-11-27 ENCOUNTER — Other Ambulatory Visit: Payer: Self-pay

## 2018-11-27 NOTE — Telephone Encounter (Signed)
CALLED PATIENT TO INFORM OF LAB AND COVID SCREENING ON 12-01-18, SPOKE WITH PATIENT AND HE IS AWARE OF THESE APPTS.

## 2018-11-27 NOTE — Progress Notes (Addendum)
Spoke w/ via phone for pre-op interview---dr Darlene Ellenbecker Lab needs dos---- none             Lab results------ekg and chest xray done 11-13-2018 epic/chart, cbc,cmet, pt, ptt to be done 11-01-2018 COVID test ------11-01-2018 Arrive at -------930 NPO after ------midnight Medications to take morning of surgery -----atorvastatin, fleets enema am 12-04-2018 Diabetic medication -----n/a Patient Special Instructions ----- Pre-Op special Istructions ----- Patient verbalized understanding of instructions that were given at this phone interview. Patient denies shortness of breath, chest pain, fever, cough a this phone interview.   Anesthesia Review: chart to jessica zanetto pa for review, patient ok for surgery center per Janett Billow zanetto pa  PCP: Jani Gravel Cardiologist : dr Darol Destine 05-29-2017 on chart. Note states to follow up in 1 year Chest x-ray :11-13-18 epic EKG :11-13-18 epic Echo :none Stress test 04-25-14 chart Cardiac Cath : 3-1- 2016 epic Sleep Study/ CPAP :none Fasting Blood Sugar :      / Checks Blood Sugar -- times a day:  n/a Blood Thinner/ Instructions /Last Dose:none ASA / Instructions/ Last Dose :81 mg aspirin stop 1 week prior per dr Diona Fanti  Patient denies shortness of breath, chest pain, fever, and cough at this phone interview.

## 2018-12-01 ENCOUNTER — Other Ambulatory Visit (HOSPITAL_COMMUNITY)
Admission: RE | Admit: 2018-12-01 | Discharge: 2018-12-01 | Disposition: A | Discharge status: Home / Self Care | Payer: PPO | Source: Ambulatory Visit | Attending: Urology | Admitting: Urology

## 2018-12-01 ENCOUNTER — Encounter (HOSPITAL_COMMUNITY)
Admission: RE | Admit: 2018-12-01 | Discharge: 2018-12-01 | Disposition: A | Discharge status: Home / Self Care | Payer: PPO | Source: Ambulatory Visit | Attending: Urology | Admitting: Urology

## 2018-12-01 ENCOUNTER — Other Ambulatory Visit: Payer: Self-pay

## 2018-12-01 DIAGNOSIS — Z01812 Encounter for preprocedural laboratory examination: Secondary | ICD-10-CM | POA: Diagnosis not present

## 2018-12-01 DIAGNOSIS — C61 Malignant neoplasm of prostate: Secondary | ICD-10-CM | POA: Diagnosis not present

## 2018-12-01 DIAGNOSIS — Z20828 Contact with and (suspected) exposure to other viral communicable diseases: Secondary | ICD-10-CM | POA: Diagnosis not present

## 2018-12-01 LAB — CBC
HCT: 47.3 % (ref 39.0–52.0)
Hemoglobin: 15.9 g/dL (ref 13.0–17.0)
MCH: 32.1 pg (ref 26.0–34.0)
MCHC: 33.6 g/dL (ref 30.0–36.0)
MCV: 95.6 fL (ref 80.0–100.0)
Platelets: 170 10*3/uL (ref 150–400)
RBC: 4.95 MIL/uL (ref 4.22–5.81)
RDW: 12.9 % (ref 11.5–15.5)
WBC: 8.7 10*3/uL (ref 4.0–10.5)
nRBC: 0 % (ref 0.0–0.2)

## 2018-12-01 LAB — PROTIME-INR
INR: 1 (ref 0.8–1.2)
Prothrombin Time: 13.3 s (ref 11.4–15.2)

## 2018-12-01 LAB — COMPREHENSIVE METABOLIC PANEL WITH GFR
ALT: 22 U/L (ref 0–44)
AST: 21 U/L (ref 15–41)
Albumin: 3.7 g/dL (ref 3.5–5.0)
Alkaline Phosphatase: 76 U/L (ref 38–126)
Anion gap: 7 (ref 5–15)
BUN: 28 mg/dL — ABNORMAL HIGH (ref 8–23)
CO2: 25 mmol/L (ref 22–32)
Calcium: 8.9 mg/dL (ref 8.9–10.3)
Chloride: 107 mmol/L (ref 98–111)
Creatinine, Ser: 1.03 mg/dL (ref 0.61–1.24)
GFR calc Af Amer: >60 mL/min
GFR calc non Af Amer: >60 mL/min
Glucose, Bld: 70 mg/dL (ref 70–99)
Potassium: 4.3 mmol/L (ref 3.5–5.1)
Sodium: 139 mmol/L (ref 135–145)
Total Bilirubin: 0.6 mg/dL (ref 0.3–1.2)
Total Protein: 6.4 g/dL — ABNORMAL LOW (ref 6.5–8.1)

## 2018-12-01 LAB — APTT: aPTT: 26 seconds (ref 24–36)

## 2018-12-02 LAB — NOVEL CORONAVIRUS, NAA (HOSP ORDER, SEND-OUT TO REF LAB; TAT 18-24 HRS): SARS-CoV-2, NAA: NOT DETECTED

## 2018-12-03 ENCOUNTER — Telehealth: Payer: Self-pay | Admitting: *Deleted

## 2018-12-03 NOTE — Anesthesia Preprocedure Evaluation (Addendum)
Anesthesia Evaluation  Patient identified by MRN, date of birth, ID band Patient awake    Reviewed: Allergy & Precautions, NPO status , Patient's Chart, lab work & pertinent test results  Airway Mallampati: II  TM Distance: >3 FB Neck ROM: Full    Dental  (+) Teeth Intact, Dental Advisory Given   Pulmonary neg pulmonary ROS,    breath sounds clear to auscultation       Cardiovascular + CAD and + Cardiac Stents   Rhythm:Regular Rate:Normal     Neuro/Psych negative neurological ROS     GI/Hepatic negative GI ROS, Neg liver ROS,   Endo/Other  negative endocrine ROS  Renal/GU negative Renal ROS     Musculoskeletal  (+) Arthritis ,   Abdominal Normal abdominal exam  (+)   Peds  Hematology negative hematology ROS (+)   Anesthesia Other Findings   Reproductive/Obstetrics                             Anesthesia Physical Anesthesia Plan  ASA: III  Anesthesia Plan: General   Post-op Pain Management:    Induction: Intravenous  PONV Risk Score and Plan: 3 and Ondansetron, Dexamethasone and Treatment may vary due to age or medical condition  Airway Management Planned: LMA  Additional Equipment: None  Intra-op Plan:   Post-operative Plan: Extubation in OR  Informed Consent: I have reviewed the patients History and Physical, chart, labs and discussed the procedure including the risks, benefits and alternatives for the proposed anesthesia with the patient or authorized representative who has indicated his/her understanding and acceptance.     Dental advisory given  Plan Discussed with: CRNA  Anesthesia Plan Comments: (I spoke with cardiologist, Dr. Einar Gip.  Pt stable, can proceed with procedure without further cardiac testing.  Konrad Felix, PA-C)       Anesthesia Quick Evaluation

## 2018-12-03 NOTE — Telephone Encounter (Signed)
CALLED PATIENT TO REMND OF IMPLANT FOR 12-04-18, SPOKE WITH PATIENT AND HE IS AWARE OF THIS IMPLANT

## 2018-12-04 ENCOUNTER — Encounter (HOSPITAL_BASED_OUTPATIENT_CLINIC_OR_DEPARTMENT_OTHER)
Admission: RE | Disposition: A | Discharge status: Home / Self Care | Payer: Self-pay | Source: Home / Self Care | Attending: Urology

## 2018-12-04 ENCOUNTER — Other Ambulatory Visit: Payer: Self-pay

## 2018-12-04 ENCOUNTER — Ambulatory Visit (HOSPITAL_BASED_OUTPATIENT_CLINIC_OR_DEPARTMENT_OTHER): Payer: PPO | Admitting: Physician Assistant

## 2018-12-04 ENCOUNTER — Ambulatory Visit (HOSPITAL_COMMUNITY): Payer: PPO

## 2018-12-04 ENCOUNTER — Encounter (HOSPITAL_BASED_OUTPATIENT_CLINIC_OR_DEPARTMENT_OTHER): Payer: Self-pay | Admitting: Certified Registered Nurse Anesthetist

## 2018-12-04 ENCOUNTER — Ambulatory Visit (HOSPITAL_BASED_OUTPATIENT_CLINIC_OR_DEPARTMENT_OTHER)
Admission: RE | Admit: 2018-12-04 | Discharge: 2018-12-04 | Disposition: A | Discharge status: Home / Self Care | Payer: PPO | Attending: Urology | Admitting: Urology

## 2018-12-04 DIAGNOSIS — Z955 Presence of coronary angioplasty implant and graft: Secondary | ICD-10-CM | POA: Diagnosis not present

## 2018-12-04 DIAGNOSIS — C61 Malignant neoplasm of prostate: Secondary | ICD-10-CM | POA: Insufficient documentation

## 2018-12-04 DIAGNOSIS — I2510 Atherosclerotic heart disease of native coronary artery without angina pectoris: Secondary | ICD-10-CM | POA: Diagnosis not present

## 2018-12-04 DIAGNOSIS — Z01818 Encounter for other preprocedural examination: Secondary | ICD-10-CM

## 2018-12-04 DIAGNOSIS — M1990 Unspecified osteoarthritis, unspecified site: Secondary | ICD-10-CM | POA: Diagnosis not present

## 2018-12-04 HISTORY — DX: Atherosclerotic heart disease of native coronary artery without angina pectoris: I25.10

## 2018-12-04 HISTORY — PX: CYSTOSCOPY: SHX5120

## 2018-12-04 HISTORY — PX: RADIOACTIVE SEED IMPLANT: SHX5150

## 2018-12-04 HISTORY — PX: SPACE OAR INSTILLATION: SHX6769

## 2018-12-04 SURGERY — INSERTION, RADIATION SOURCE, PROSTATE
Anesthesia: General | Site: Rectum

## 2018-12-04 MED ORDER — PHENYLEPHRINE 40 MCG/ML (10ML) SYRINGE FOR IV PUSH (FOR BLOOD PRESSURE SUPPORT)
PREFILLED_SYRINGE | INTRAVENOUS | Status: DC | PRN
  Administered 2018-12-04 (×2): 80 ug via INTRAVENOUS

## 2018-12-04 MED ORDER — FLEET ENEMA 7-19 GM/118ML RE ENEM
1.0000 | ENEMA | Freq: Once | RECTAL | Status: DC
  Filled 2018-12-04: qty 1

## 2018-12-04 MED ORDER — CEFAZOLIN SODIUM-DEXTROSE 2-4 GM/100ML-% IV SOLN
INTRAVENOUS | Status: AC
  Filled 2018-12-04: qty 100

## 2018-12-04 MED ORDER — OXYCODONE HCL 5 MG/5ML PO SOLN
5.0000 mg | Freq: Once | ORAL | Status: DC | PRN
  Filled 2018-12-04: qty 5

## 2018-12-04 MED ORDER — ACETAMINOPHEN 10 MG/ML IV SOLN
1000.0000 mg | Freq: Once | INTRAVENOUS | Status: DC | PRN
  Filled 2018-12-04: qty 100

## 2018-12-04 MED ORDER — CEFAZOLIN SODIUM-DEXTROSE 2-4 GM/100ML-% IV SOLN
2.0000 g | Freq: Once | INTRAVENOUS | Status: AC
  Administered 2018-12-04: 2 g via INTRAVENOUS
  Filled 2018-12-04: qty 100

## 2018-12-04 MED ORDER — IOHEXOL 300 MG/ML  SOLN
INTRAMUSCULAR | Status: DC | PRN
  Administered 2018-12-04: 7 mL

## 2018-12-04 MED ORDER — HYDROMORPHONE HCL 1 MG/ML IJ SOLN
0.2500 mg | INTRAMUSCULAR | Status: DC | PRN
  Filled 2018-12-04: qty 0.5

## 2018-12-04 MED ORDER — ONDANSETRON HCL 4 MG/2ML IJ SOLN
INTRAMUSCULAR | Status: DC | PRN
  Administered 2018-12-04: 4 mg via INTRAVENOUS

## 2018-12-04 MED ORDER — SODIUM CHLORIDE (PF) 0.9 % IJ SOLN
INTRAMUSCULAR | Status: DC | PRN
  Administered 2018-12-04: 10 mL

## 2018-12-04 MED ORDER — OXYCODONE HCL 5 MG PO TABS
5.0000 mg | ORAL_TABLET | Freq: Once | ORAL | Status: DC | PRN
  Filled 2018-12-04: qty 1

## 2018-12-04 MED ORDER — LIDOCAINE 2% (20 MG/ML) 5 ML SYRINGE
INTRAMUSCULAR | Status: DC | PRN
  Administered 2018-12-04: 80 mg via INTRAVENOUS

## 2018-12-04 MED ORDER — MEPERIDINE HCL 25 MG/ML IJ SOLN
6.2500 mg | INTRAMUSCULAR | Status: DC | PRN
  Filled 2018-12-04: qty 1

## 2018-12-04 MED ORDER — ACETAMINOPHEN 160 MG/5ML PO SOLN
325.0000 mg | Freq: Once | ORAL | Status: AC | PRN
  Filled 2018-12-04: qty 20.3

## 2018-12-04 MED ORDER — LACTATED RINGERS IV SOLN
INTRAVENOUS | Status: DC
  Administered 2018-12-04: 10:00:00 50 mL/h via INTRAVENOUS
  Filled 2018-12-04: qty 1000

## 2018-12-04 MED ORDER — LACTATED RINGERS IV SOLN
INTRAVENOUS | Status: DC
  Filled 2018-12-04: qty 1000

## 2018-12-04 MED ORDER — DEXAMETHASONE SODIUM PHOSPHATE 10 MG/ML IJ SOLN
INTRAMUSCULAR | Status: DC | PRN
  Administered 2018-12-04: 10 mg via INTRAVENOUS

## 2018-12-04 MED ORDER — EPHEDRINE SULFATE-NACL 50-0.9 MG/10ML-% IV SOSY
PREFILLED_SYRINGE | INTRAVENOUS | Status: DC | PRN
  Administered 2018-12-04: 10 mg via INTRAVENOUS
  Administered 2018-12-04 (×2): 15 mg via INTRAVENOUS

## 2018-12-04 MED ORDER — PROMETHAZINE HCL 25 MG/ML IJ SOLN
6.2500 mg | INTRAMUSCULAR | Status: DC | PRN
  Filled 2018-12-04: qty 1

## 2018-12-04 MED ORDER — FENTANYL CITRATE (PF) 100 MCG/2ML IJ SOLN
INTRAMUSCULAR | Status: DC | PRN
  Administered 2018-12-04: 25 ug via INTRAVENOUS
  Administered 2018-12-04: 75 ug via INTRAVENOUS

## 2018-12-04 MED ORDER — ACETAMINOPHEN 325 MG PO TABS
ORAL_TABLET | ORAL | Status: AC
  Filled 2018-12-04: qty 3

## 2018-12-04 MED ORDER — PROPOFOL 10 MG/ML IV BOLUS
INTRAVENOUS | Status: DC | PRN
  Administered 2018-12-04: 30 mg via INTRAVENOUS
  Administered 2018-12-04: 100 mg via INTRAVENOUS
  Administered 2018-12-04: 200 mg via INTRAVENOUS

## 2018-12-04 MED ORDER — SODIUM CHLORIDE 0.9 % IR SOLN
Status: DC | PRN
  Administered 2018-12-04: 1000 mL via INTRAVESICAL

## 2018-12-04 MED ORDER — ACETAMINOPHEN 325 MG PO TABS
325.0000 mg | ORAL_TABLET | Freq: Once | ORAL | Status: AC | PRN
  Administered 2018-12-04: 650 mg via ORAL
  Filled 2018-12-04: qty 2

## 2018-12-04 SURGICAL SUPPLY — 35 items
BAG URINE DRAINAGE (UROLOGICAL SUPPLIES) ×5 IMPLANT
BLADE CLIPPER SENSICLIP SURGIC (BLADE) ×5 IMPLANT
CATH FOLEY 2WAY SLVR  5CC 16FR (CATHETERS) ×2
CATH FOLEY 2WAY SLVR 5CC 16FR (CATHETERS) ×3 IMPLANT
CATH ROBINSON RED A/P 16FR (CATHETERS) IMPLANT
CATH ROBINSON RED A/P 20FR (CATHETERS) ×5 IMPLANT
CLOTH BEACON ORANGE TIMEOUT ST (SAFETY) ×5 IMPLANT
CONT SPECI 4OZ STER CLIK (MISCELLANEOUS) ×10 IMPLANT
COVER BACK TABLE 60X90IN (DRAPES) ×5 IMPLANT
COVER MAYO STAND STRL (DRAPES) ×5 IMPLANT
DRSG TEGADERM 4X4.75 (GAUZE/BANDAGES/DRESSINGS) ×5 IMPLANT
DRSG TEGADERM 8X12 (GAUZE/BANDAGES/DRESSINGS) ×5 IMPLANT
GAUZE SPONGE 4X4 12PLY STRL (GAUZE/BANDAGES/DRESSINGS) ×5 IMPLANT
GLOVE BIO SURGEON STRL SZ7 (GLOVE) ×5 IMPLANT
GLOVE BIO SURGEON STRL SZ7.5 (GLOVE) IMPLANT
GLOVE BIO SURGEON STRL SZ8 (GLOVE) ×10 IMPLANT
GLOVE BIOGEL PI IND STRL 7.5 (GLOVE) ×6 IMPLANT
GLOVE BIOGEL PI INDICATOR 7.5 (GLOVE) ×4
GLOVE SURG ORTHO 8.5 STRL (GLOVE) ×5 IMPLANT
GLOVE SURG SS PI 6.5 STRL IVOR (GLOVE) IMPLANT
GOWN STRL REUS W/TWL XL LVL3 (GOWN DISPOSABLE) ×5 IMPLANT
HOLDER FOLEY CATH W/STRAP (MISCELLANEOUS) ×5 IMPLANT
I SEED AGX100 ×455 IMPLANT
IMPL SPACEOAR SYSTEM 10ML (Spacer) ×3 IMPLANT
IMPLANT SPACEOAR SYSTEM 10ML (Spacer) ×5 IMPLANT
IV NS 1000ML (IV SOLUTION) ×2
IV NS 1000ML BAXH (IV SOLUTION) ×3 IMPLANT
KIT TURNOVER CYSTO (KITS) ×5 IMPLANT
MARKER SKIN DUAL TIP RULER LAB (MISCELLANEOUS) ×5 IMPLANT
PACK CYSTO (CUSTOM PROCEDURE TRAY) ×5 IMPLANT
SUT BONE WAX W31G (SUTURE) IMPLANT
SYR 10ML LL (SYRINGE) ×5 IMPLANT
TOWEL OR 17X26 10 PK STRL BLUE (TOWEL DISPOSABLE) ×5 IMPLANT
UNDERPAD 30X30 (UNDERPADS AND DIAPERS) ×10 IMPLANT
WATER STERILE IRR 500ML POUR (IV SOLUTION) ×5 IMPLANT

## 2018-12-04 NOTE — Anesthesia Procedure Notes (Signed)
Procedure Name: LMA Insertion Date/Time: 12/04/2018 11:51 AM Performed by: [place] Oni Dietzman C, CRNA Pre-anesthesia Checklist: Patient identified, Emergency Drugs available, Suction available and Patient being monitored Patient Re-evaluated:Patient Re-evaluated prior to induction Oxygen Delivery Method: Circle system utilized Preoxygenation: Pre-oxygenation with 100% oxygen Induction Type: IV induction Ventilation: Mask ventilation without difficulty LMA: LMA inserted LMA Size: 4.0 Number of attempts: 1 Airway Equipment and Method: Bite block Placement Confirmation: positive ETCO2 Tube secured with: Tape Dental Injury: Teeth and Oropharynx as per pre-operative assessment

## 2018-12-04 NOTE — H&P (Signed)
H&P  Chief Complaint: Prostate cancer  History of Present Illness: 73 year old male, retired Chief Financial Officer, presents for I-125 brachytherapy for management of low/intermediate risk prostate cancer.  Past Medical History:  Diagnosis Date  . Coronary artery disease   . No pertinent past medical history   . Osteoarthritis   . Prostate cancer [facility])     Past Surgical History:  Procedure Laterality Date  . ACHILLES TENDON SURGERY  05/23/2011   Procedure: ACHILLES TENDON REPAIR;  Surgeon: Ninetta Lights, MD;  Location: [place];  Service: Orthopedics;  Laterality: Left;  left ankle repair rupture achilles tendon with end to end asastomisis and distal reattachment  . COLONOSCOPY    . DUPUYTREN / PALMAR FASCIOTOMY     lt   . EYE SURGERY     lazer torn retina rt eye  . KNEE ARTHROSCOPY     both  . LEFT HEART CATHETERIZATION WITH CORONARY ANGIOGRAM N/A 04/26/2014   Procedure: LEFT HEART CATHETERIZATION WITH CORONARY ANGIOGRAM;  Surgeon: Laverda Page, MD;  Location[facility] CATH LAB;  Service: Cardiovascular;  Laterality: N/A;  . SHOULDER ARTHROSCOPY     rt  . stents to heart  2016   x 2  . TRIGGER FINGER RELEASE Bilateral    multiple    Home Medications:    Allergies: No Known Allergies  Family History  Problem Relation Age of Onset  . Diabetes Mother   . Colon cancer Neg Hx   . Rectal cancer Neg Hx   . Stomach cancer Neg Hx   . Prostate cancer Neg Hx     Social History:  reports that he has never smoked. He has never used smokeless tobacco. He reports that he does not drink alcohol or use drugs.  ROS: A complete review of systems was performed.  All systems are negative except for pertinent findings as noted.  Physical Exam:  Vital signs in last 24 hours: Temp:  [98.1 F (36.7 C)] 98.1 F (36.7 C) (10/09 0959) Pulse Rate:  [55] 55 (10/09 0959) Resp:  [16] 16 (10/09 0959) BP: (117)/(75) 117/75 (10/09 0959) SpO2:  [99 %] 99 % (10/09 0959) Weight:   [80.4 kg] 80.4 kg (10/09 0959) Constitutional:  Alert and oriented, No acute distress Cardiovascular: Regular rate  Respiratory: Normal respiratory effort GI: Abdomen is soft, nontender, nondistended, no abdominal masses. No CVAT.  Genitourinary: Normal male phallus, testes are descended bilaterally and non-tender and without masses, scrotum is normal in appearance without lesions or masses, perineum is normal on inspection. Lymphatic: No lymphadenopathy Neurologic: Grossly intact, no focal deficits Psychiatric: Normal mood and affect  Laboratory Data:  Recent Labs    12/01/18 1418  WBC 8.7  HGB 15.9  HCT 47.3  PLT 170    Recent Labs    12/01/18 1418  NA 139  K 4.3  CL 107  GLUCOSE 70  BUN 28*  CALCIUM 8.9  CREATININE 1.03     No results found for this or any previous visit (from the past 24 hour(s)). Recent Results (from the past 240 hour(s))  Novel Coronavirus, NAA (Hosp order, Send-out to Ref Lab; TAT 18-24 hrs     Status: None   Collection Time: 12/01/18  2:48 PM   Specimen: Nasopharyngeal Swab; Respiratory  Result Value Ref Range Status   SARS-CoV-2, NAA NOT DETECTED NOT DETECTED Final    Comment: (NOTE) This nucleic acid amplification test was developed and its performance characteristics determined by [employer]. Nucleic acid amplification tests include  PCR and TMA. This test has not been FDA cleared or approved. This test has been authorized by FDA under an Emergency Use Authorization (EUA). This test is only authorized for the duration of time the declaration that circumstances exist justifying the authorization of the emergency use of in vitro diagnostic tests for detection of SARS-CoV-2 virus and/or diagnosis of COVID-19 infection under section 564(b)(1) of the Act, 21 U.S.C. [id number]) (1), unless the authorization is terminated or revoked sooner. When diagnostic testing is negative, the possibility of a false negative result should be  considered in the context of a patient's recent exposures and the presence of clinical signs and symptoms consistent with COVID-19. An individual without symptoms of COVID- 19 and who is not shedding SARS-CoV-2 vi rus would expect to have a negative (not detected) result in this assay. Performed At: [facility] [address] [place], [place] [id number] Rush Farmer MD [id number]    [place]  Final    Comment: Performed at [facility], [place] [address], [place], [place] [address]    Renal Function: Recent Labs    12/01/18 1418  CREATININE 1.03   Estimated Creatinine Clearance: 60 mL/min (by C-G formula based on SCr of 1.03 mg/dL).  Radiologic Imaging: No results found.  Impression/Assessment:  Prostate cancer  Plan:  I-125 brachii therapy, placement of space oar

## 2018-12-04 NOTE — Op Note (Signed)
Preoperative diagnosis: Clinical stage TI C adenocarcinoma the prostate   Postoperative diagnosis: Same   Procedure: I-125 prostate seed implantation, placement of SpaceOAR, flexible cystoscopy  Surgeon: Lillette Boxer. Baneen Wieseler M.D.  Radiation Oncologist: Tyler Pita, M.D.  Anesthesia: Gen.   Indications: Patient  was diagnosed with clinical stage TI C prostate cancer. We had extensive discussion with him about treatment options versus. He elected to proceed with seed implantation. He underwent consultation my office as well as with Dr. Tammi Klippel. He appeared to understand the advantages disadvantages potential risks of this treatment option. Full informed consent has been obtained.   Technique and findings: Patient was brought the operating room where he had successful induction of general anesthesia. He was placed in dorso-lithotomy position and prepped and draped in usual manner. Appropriate surgical timeout was performed. Radiation oncology department placed a transrectal ultrasound probe anchoring stand. Foley catheter with contrast in the balloon was inserted without difficulty. Anchoring needles were placed within the prostate. Rectal tube was placed. Real-time contouring of the urethra prostate and rectum were performed and the dosing parameters were established. Targeted dose was 145 gray.  I was then called  to the operating suite suite for placement of the needles. A second timeout was performed. All needle passage was done with real-time transrectal ultrasound guidance with the sagittal plane. A total of 29 needles were placed.  91 active seeds were implanted.  I then proceeded with placement of SpaceOAR by introducing a needle with the bevel angled inferiorly approximately 2 cm superior to the anus. This was angled downward and under direct ultrasound was placed within the space between the prostatic capsule and rectum. This was confirmed with a small amount of sterile saline injected  and this was performed under direct ultrasound. I then attached the SpaceOAR to the needle and injected this in the space between the prostate and rectum with good placement noted. The Foley catheter was removed and flexible cystoscopy failed to show any seeds outside the prostate.  The patient was brought to recovery room in stable condition, having tolerated the procedure well.Marland Kitchen

## 2018-12-04 NOTE — Anesthesia Postprocedure Evaluation (Signed)
Anesthesia Post Note  Patient: ROSENDO CONIGLIO  Procedure(s) Performed: RADIOACTIVE SEED IMPLANT/BRACHYTHERAPY IMPLANT (N/A Prostate) SPACE OAR INSTILLATION (N/A Rectum) CYSTOSCOPY FLEXIBLE     Patient location during evaluation: PACU Anesthesia Type: General Level of consciousness: awake and alert Pain management: pain level controlled Vital Signs Assessment: post-procedure vital signs reviewed and stable Respiratory status: spontaneous breathing, nonlabored ventilation, respiratory function stable and patient connected to nasal cannula oxygen Cardiovascular status: blood pressure returned to baseline and stable Postop Assessment: no apparent nausea or vomiting Anesthetic complications: no    Last Vitals:  Vitals:   12/04/18 1345 12/04/18 1430  BP: 129/75 115/73  Pulse: 62 (!) 59  Resp: 16 17  Temp:  36.4 C  SpO2: 98% 99%    Last Pain:  Vitals:   12/04/18 1411  TempSrc:   PainSc: 2                  Effie Berkshire

## 2018-12-04 NOTE — Discharge Instructions (Signed)
Radioactive Seed Implant Home Care Instructions   Activity:    Rest for the remainder of the day.  Do not drive or operate equipment today.  You may resume normal  activities in a few days as instructed by your physician, without risk of harmful radiation exposure to those around you, provided you follow the time and distance precautions on the Radiation Oncology Instruction Sheet.   Meals: Drink plenty of lipuids and eat light foods, such as gelatin or soup this evening .  You may return to normal meal plan tomorrow.  Return To Work: You may return to work as instructed by [employer].  Special Instruction:   If any seeds are found, use tweezers to pick up seeds and place in a glass container of any kind and bring to your physician's office.  Call your physician if any of these symptoms occur:   Persistent or heavy bleeding  Urine stream diminishes or stops completely after catheter is removed  Fever equal to or greater than 101 degrees F  Cloudy urine with a strong foul odor  Severe pain  You may feel some burning pain and/or hesitancy when you urinate after the catheter is removed.  These symptoms may increase over the next few weeks, but should diminish within forur to six weeks.  Applying moist heat to the lower abdomen or a hot tub bath may help relieve the pain.  If the discomfort becomes severe, please call your physician for additional medications.   Post Anesthesia Home Care Instructions  Activity: Get plenty of rest for the remainder of the day. A responsible adult should stay with you for 24 hours following the procedure.  For the next 24 hours, DO NOT: -Drive a car -Paediatric nurse -Drink alcoholic beverages -Take any medication unless instructed by your physician -Make any legal decisions or sign important papers.  Meals: Start with liquid foods such as gelatin or soup. Progress to regular foods as tolerated. Avoid greasy, spicy, heavy foods. If nausea  and/or vomiting occur, drink only clear liquids until the nausea and/or vomiting subsides. Call your physician if vomiting continues.  Special Instructions/Symptoms: Your throat may feel dry or sore from the anesthesia or the breathing tube placed in your throat during surgery. If this causes discomfort, gargle with warm salt water. The discomfort should disappear within 24 hours.  If you had a scopolamine patch placed behind your ear for the management of post- operative nausea and/or vomiting:  1. The medication in the patch is effective for 72 hours, after which it should be removed.  Wrap patch in a tissue and discard in the trash. Wash hands thoroughly with soap and water. 2. You may remove the patch earlier than 72 hours if you experience unpleasant side effects which may include dry mouth, dizziness or visual disturbances. 3. Avoid touching the patch. Wash your hands with soap and water after contact with the patch.    Post Anesthesia Home Care Instructions  Activity: Get plenty of rest for the remainder of the day. A responsible adult should stay with you for 24 hours following the procedure.  For the next 24 hours, DO NOT: -Drive a car -Paediatric nurse -Drink alcoholic beverages -Take any medication unless instructed by your physician -Make any legal decisions or sign important papers.  Meals: Start with liquid foods such as gelatin or soup. Progress to regular foods as tolerated. Avoid greasy, spicy, heavy foods. If nausea and/or vomiting occur, drink only clear liquids until the nausea and/or vomiting  subsides. Call your physician if vomiting continues.  Special Instructions/Symptoms: Your throat may feel dry or sore from the anesthesia or the breathing tube placed in your throat during surgery. If this causes discomfort, gargle with warm salt water. The discomfort should disappear within 24 hours.  If you had a scopolamine patch placed behind your ear for the management of  post- operative nausea and/or vomiting:  1. The medication in the patch is effective for 72 hours, after which it should be removed.  Wrap patch in a tissue and discard in the trash. Wash hands thoroughly with soap and water. 2. You may remove the patch earlier than 72 hours if you experience unpleasant side effects which may include dry mouth, dizziness or visual disturbances. 3. Avoid touching the patch. Wash your hands with soap and water after contact with the patch.

## 2018-12-04 NOTE — Transfer of Care (Signed)
Immediate Anesthesia Transfer of Care Note  Patient: Vernon Thomas  Procedure(s) Performed: RADIOACTIVE SEED IMPLANT/BRACHYTHERAPY IMPLANT (N/A Prostate) SPACE OAR INSTILLATION (N/A Rectum) CYSTOSCOPY FLEXIBLE  Patient Location: PACU  Anesthesia Type:General  Level of Consciousness: drowsy  Airway & Oxygen Therapy: Patient Spontanous Breathing and Patient connected to nasal cannula oxygen  Post-op Assessment: Report given to RN and Post -op Vital signs reviewed and stable  Post vital signs: Reviewed and stable  Last Vitals:  Vitals Value Taken Time  BP 124/72 12/04/18 1320  Temp 36.4 C 12/04/18 1320  Pulse 71 12/04/18 1322  Resp 10 12/04/18 1322  SpO2 100 % 12/04/18 1322  Vitals shown include unvalidated device data.  Last Pain:  Vitals:   12/04/18 0959  TempSrc: Oral  PainSc: 1       Patients Stated Pain Goal: 2 ([id number])  Complications: No apparent anesthesia complications

## 2018-12-05 NOTE — Progress Notes (Signed)
  Radiation Oncology         (336) [phone] ________________________________  Name: Vernon Thomas MRN: [id number]  Date: 12/05/2018  DOB: 1945/05/09       Prostate Seed Implant  CC:Kim, Jeneen Rinks, MD  No ref. provider found  DIAGNOSIS: 73 y.o. gentleman with Stage T1c adenocarcinoma of the prostate with Gleason score of 3+4, and PSA of 4.66    ICD-10-CM   1. Preop testing  Z01.818 DG Chest 2 View    DG Chest 2 View    PROCEDURE: Insertion of radioactive I-125 seeds into the prostate gland.  RADIATION DOSE: 145 Gy, definitive therapy.  TECHNIQUE: Vernon Thomas was brought to the operating room with the urologist. He was placed in the dorsolithotomy position. He was catheterized and a rectal tube was inserted. The perineum was shaved, prepped and draped. The ultrasound probe was then introduced into the rectum to see the prostate gland.  TREATMENT DEVICE: A needle grid was attached to the ultrasound probe stand and anchor needles were placed.  3D PLANNING: The prostate was imaged in 3D using a sagittal sweep of the prostate probe. These images were transferred to the planning computer. There, the prostate, urethra and rectum were defined on each axial reconstructed image. Then, the software created an optimized 3D plan and a few seed positions were adjusted. The quality of the plan was reviewed using [facility] information for the target and the following two organs at risk:  Urethra and Rectum.  Then the accepted plan was printed and handed off to the radiation therapist.  Under my supervision, the custom loading of the seeds and spacers was carried out and loaded into sealed vicryl sleeves.  These pre-loaded needles were then placed into the needle holder.Marland Kitchen  PROSTATE VOLUME STUDY:  Using transrectal ultrasound the volume of the prostate was verified to be 44.3 cc.  SPECIAL TREATMENT PROCEDURE/SUPERVISION AND HANDLING: The pre-loaded needles were then delivered under sagittal guidance. A  total of 29 needles were used to deposit 91 seeds in the prostate gland. The individual seed activity was 0.379 mCi.  SpaceOAR:  Yes  COMPLEX SIMULATION: At the end of the procedure, an anterior radiograph of the pelvis was obtained to document seed positioning and count. Cystoscopy was performed to check the urethra and bladder.  MICRODOSIMETRY: At the end of the procedure, the patient was emitting 0.07 mR/hr at 1 meter. Accordingly, he was considered safe for hospital discharge.  PLAN: The patient will return to the radiation oncology clinic for post implant CT dosimetry in three weeks.   ________________________________  Sheral Apley Tammi Klippel, M.D.

## 2018-12-07 ENCOUNTER — Encounter (HOSPITAL_BASED_OUTPATIENT_CLINIC_OR_DEPARTMENT_OTHER): Payer: Self-pay | Admitting: Urology

## 2018-12-07 ENCOUNTER — Telehealth: Payer: Self-pay | Admitting: *Deleted

## 2018-12-07 NOTE — Telephone Encounter (Signed)
CALLED PATIENT TO INFORM OF POST SEED APPTS. AND MRI, SPOKE WITH PATIENT AND HE IS AWARE OF THESE APPTS

## 2018-12-24 ENCOUNTER — Telehealth: Payer: Self-pay | Admitting: *Deleted

## 2018-12-24 NOTE — Telephone Encounter (Signed)
CALLED PATIENT TO REMIND OF POST SEED APPTS. AND MRI FOR 12-25-18, SPOKE WITH PATIENT AND HE IS AWARE OF THESE APPTS.

## 2018-12-25 ENCOUNTER — Ambulatory Visit
Admission: RE | Admit: 2018-12-25 | Discharge: 2018-12-25 | Disposition: A | Discharge status: Home / Self Care | Payer: PPO | Source: Ambulatory Visit | Attending: Urology | Admitting: Urology

## 2018-12-25 ENCOUNTER — Ambulatory Visit (HOSPITAL_COMMUNITY)
Admission: RE | Admit: 2018-12-25 | Discharge: 2018-12-25 | Disposition: A | Discharge status: Home / Self Care | Payer: PPO | Source: Ambulatory Visit | Attending: Urology | Admitting: Urology

## 2018-12-25 ENCOUNTER — Encounter: Payer: Self-pay | Admitting: Medical Oncology

## 2018-12-25 ENCOUNTER — Other Ambulatory Visit: Payer: Self-pay

## 2018-12-25 ENCOUNTER — Encounter: Payer: Self-pay | Admitting: Urology

## 2018-12-25 ENCOUNTER — Ambulatory Visit
Admission: RE | Admit: 2018-12-25 | Discharge: 2018-12-25 | Disposition: A | Discharge status: Home / Self Care | Payer: PPO | Source: Ambulatory Visit | Attending: Radiation Oncology | Admitting: Radiation Oncology

## 2018-12-25 VITALS — BP 118/78 | HR 78 | Temp 98.0°F | Wt 173.8 lb

## 2018-12-25 DIAGNOSIS — C61 Malignant neoplasm of prostate: Secondary | ICD-10-CM

## 2018-12-25 NOTE — Progress Notes (Signed)
Patient here for  follow up post seed implant. He is experiencing urinary frequency and urgency. He states he has to urinate about every 2 hours. We discussed the side effects and how they will continue to improve over the next couple of months. He will see Ashlyn today and Dr. Diona Fanti this afternoon.

## 2018-12-25 NOTE — Progress Notes (Signed)
Radiation Oncology         (336) [phone] ________________________________  Name: Vernon Thomas MRN: [id number]  Date: 12/25/2018  DOB: 01-Dec-1945  Post-Seed Follow-Up Visit Note  CC: Jani Gravel, MD  Franchot Gallo, MD  Diagnosis:   73 y.o.gentleman with Stage T1cadenocarcinoma of the prostate with Gleason score of 3+4, and PSA of4.66    ICD-10-CM   1. Malignant neoplasm of prostate (HCC)  C61     Interval Since Last Radiation:  3 weeks 12/04/18:  Insertion of radioactive I-125 seeds into the prostate gland; 145 Gy, definitive/boost therapy with placement of SpaceOAR gel.  Narrative:  The patient returns today for routine follow-up.  He is complaining of increased urinary frequency and urinary hesitation symptoms. He filled out a questionnaire regarding urinary function today providing and overall IPSS score of 26 characterizing his symptoms as severe with increased frequency, urgency, intermittency, hesitancy, weak stream and nocturia x3-4/night.  He denies gross hematuria, dysuria, flank pain, suprapubic discomfort, incomplete bladder emptying or incontinence.  His pre-implant score was 8. He denies any abdominal pain but has noted an increase in frequency of BMs with occasional loose stools.  ALLERGIES:  has No Known Allergies.  Meds: Current Outpatient Medications  Medication Sig Dispense Refill  . aspirin EC 81 MG tablet Take 81 mg by mouth at bedtime.    Marland Kitchen ibuprofen (ADVIL,MOTRIN) 200 MG tablet Take 400-600 mg by mouth every 6 (six) hours as needed (pain).     . Multiple Vitamin (MULTIVITAMIN WITH MINERALS) TABS tablet Take 1 tablet by mouth daily.    . naproxen sodium (ANAPROX) 220 MG tablet Take 220 mg by mouth daily as needed (pain). Aleve    . nitroGLYCERIN (NITROSTAT) 0.4 MG SL tablet Place 0.4 mg under the tongue every 5 (five) minutes as needed for chest pain.    . Omega-3 Fatty Acids (FISH OIL PO) Take 1 capsule by mouth at bedtime.    . rosuvastatin (CRESTOR)  20 MG tablet TAKE ONE TABLET BY MOUTH ONE TIME DAILY  90 tablet 2  . traMADol (ULTRAM) 50 MG tablet Take 50 mg by mouth every 6 (six) hours as needed (pain).      No current facility-administered medications for this visit.     Physical Findings: In general this is a well appearing Caucasian male in no acute distress. He's alert and oriented x4 and appropriate throughout the examination. Cardiopulmonary assessment is negative for acute distress and he exhibits normal effort.   Lab Findings: Lab Results  Component Value Date   WBC 8.7 12/01/2018   HGB 15.9 12/01/2018   HCT 47.3 12/01/2018   MCV 95.6 12/01/2018   PLT 170 12/01/2018    Radiographic Findings:  Patient underwent CT imaging in our clinic for post implant dosimetry. The CT will be reviewed by Dr. Tammi Klippel to confirm there is an adequate distribution of radioactive seeds throughout the prostate gland and ensure that there are no seeds in or near the rectum. His scheduled for prostate MRI today at 1pm and those images will be fused with his CT images for further evaluation. We suspect the final radiation plan and dosimetry will show appropriate coverage of the prostate gland. He understands that we will call and inform him of any unexpected findings on further review of his imaging and dosimetry.  Impression/Plan: 73 y.o.gentleman with Stage T1cadenocarcinoma of the prostate with Gleason score of 3+4, and PSA of4.66. The patient is recovering from the effects of radiation. His urinary symptoms should gradually  improve over the next 4-6 months. We talked about this today. He is encouraged by his improvement already and is otherwise pleased with his outcome. We also talked about long-term follow-up for prostate cancer following seed implant. He understands that ongoing PSA determinations and digital rectal exams will help perform surveillance to rule out disease recurrence. He has a follow up appointment scheduled with Jiles Crocker, NP  today and will see Dr. Diona Fanti in 02/2019. He understands what to expect with his PSA measures. Patient was also educated today about some of the long-term effects from radiation including a small risk for rectal bleeding and possibly erectile dysfunction. We talked about some of the general management approaches to these potential complications. However, I did encourage the patient to contact our office or return at any point if he has questions or concerns related to his previous radiation and prostate cancer.    Nicholos Johns, PA-C

## 2018-12-25 NOTE — Progress Notes (Signed)
Patient in for post seed implant. Up every 2 hours to urinate has some urgency. Does not empty bladder.Has an appointment with urologist today. Has MRI at 1pm IPSS 26/35

## 2018-12-25 NOTE — Patient Instructions (Signed)
Coronavirus (COVID-19) Are you at risk?  Are you at risk for the Coronavirus (COVID-19)?  To be considered HIGH RISK for Coronavirus (COVID-19), you have to meet the following criteria:  . Traveled to [place], [place], [place], [place] or [place]; or in the [place] to [place], [place], [place], or [place]; and have fever, cough, and shortness of breath within the last 2 weeks of travel OR . Been in close contact with a person diagnosed with COVID-19 within the last 2 weeks and have fever, cough, and shortness of breath . IF YOU DO NOT MEET THESE CRITERIA, YOU ARE CONSIDERED LOW RISK FOR COVID-19.  What to do if you are HIGH RISK for COVID-19?  . If you are having a medical emergency, call 911. . Seek medical care right away. Before you go to a doctor's office, urgent care or emergency department, call ahead and tell them about your recent travel, contact with someone diagnosed with COVID-19, and your symptoms. You should receive instructions from your physician's office regarding next steps of care.  . When you arrive at healthcare provider, tell the healthcare staff immediately you have returned from visiting [place], [place], [place], [place] or [place]; or traveled in the [place] to [place], [place], [place], or [place]; in the last two weeks or you have been in close contact with a person diagnosed with COVID-19 in the last 2 weeks.   . Tell the health care staff about your symptoms: fever, cough and shortness of breath. . After you have been seen by a medical provider, you will be either: o Tested for (COVID-19) and discharged home on quarantine except to seek medical care if symptoms worsen, and asked to  - Stay home and avoid contact with others until you get your results (4-5 days)  - Avoid travel on public transportation if possible (such as bus, train, or airplane) or o Sent to the Emergency Department by EMS for evaluation, COVID-19 testing, and possible  admission depending on your condition and test results.  What to do if you are LOW RISK for COVID-19?  Reduce your risk of any infection by using the same precautions used for avoiding the common cold or flu:  . Wash your hands often with soap and warm water for at least 20 seconds.  If soap and water are not readily available, use an alcohol-based hand sanitizer with at least 60% alcohol.  . If coughing or sneezing, cover your mouth and nose by coughing or sneezing into the elbow areas of your shirt or coat, into a tissue or into your sleeve (not your hands). . Avoid shaking hands with others and consider head nods or verbal greetings only. . Avoid touching your eyes, nose, or mouth with unwashed hands.  . Avoid close contact with people who are sick. . Avoid places or events with large numbers of people in one location, like concerts or sporting events. . Carefully consider travel plans you have or are making. . If you are planning any travel outside or inside the [place], visit the CDC's Travelers' Health webpage for the latest health notices. . If you have some symptoms but not all symptoms, continue to monitor at home and seek medical attention if your symptoms worsen. . If you are having a medical emergency, call 911.   ADDITIONAL HEALTHCARE OPTIONS FOR PATIENTS  [facility] / e-Visit: [email or web address]         [facility]: [phone]  [facility]: [phone]                   [facility]: [phone]   

## 2018-12-27 NOTE — Progress Notes (Signed)
  Radiation Oncology         (336) [phone] ________________________________  Name: Vernon Thomas MRN: [id number]  Date: 12/25/2018  DOB: [birth year]  COMPLEX SIMULATION NOTE  NARRATIVE:  The patient was brought to the [facility] today following prostate seed implantation approximately one month ago.  Identity was confirmed.  All relevant records and images related to the planned course of therapy were reviewed.  Then, the patient was set-up supine.  CT images were obtained.  The CT images were loaded into the planning software.  Then the prostate and rectum were contoured.  Treatment planning then occurred.  The implanted iodine 125 seeds were identified by the physics staff for projection of radiation distribution  I have requested : 3D Simulation  I have requested a DVH of the following structures: Prostate and rectum.    ________________________________  Sheral Apley Tammi Klippel, M.D.

## 2019-01-05 ENCOUNTER — Encounter: Payer: Self-pay | Admitting: Radiation Oncology

## 2019-01-05 ENCOUNTER — Ambulatory Visit
Admission: RE | Admit: 2019-01-05 | Discharge: 2019-01-05 | Disposition: A | Discharge status: Home / Self Care | Payer: PPO | Source: Ambulatory Visit | Attending: Radiation Oncology | Admitting: Radiation Oncology

## 2019-01-05 DIAGNOSIS — C61 Malignant neoplasm of prostate: Secondary | ICD-10-CM | POA: Diagnosis not present

## 2019-01-09 NOTE — Progress Notes (Signed)
  Radiation Oncology         (336) [phone] ________________________________  Name: Vernon Thomas MRN: [id number]  Date: 01/05/2019  DOB: 1945/06/26  3D Planning Note   Prostate Brachytherapy Post-Implant Dosimetry  Diagnosis: 73 y.o. gentleman with Stage T1c adenocarcinoma of the prostate with Gleason score of 3+4, and PSA of 4.66  Narrative: On a previous date, Vernon Thomas returned following prostate seed implantation for post implant planning. He underwent CT scan complex simulation to delineate the three-dimensional structures of the pelvis and demonstrate the radiation distribution.  Since that time, the seed localization, and complex isodose planning with dose volume histograms have now been completed.  Results:   Prostate Coverage - The dose of radiation delivered to the 90% or more of the prostate gland (D90) was 103.57% of the prescription dose. This exceeds our goal of greater than 90%. Rectal Sparing - The volume of rectal tissue receiving the prescription dose or higher was 0.0 cc. This falls under our thresholds tolerance of 1.0 cc.  Impression: The prostate seed implant appears to show adequate target coverage and appropriate rectal sparing.  Plan:  The patient will continue to follow with urology for ongoing PSA determinations. I would anticipate a high likelihood for local tumor control with minimal risk for rectal morbidity.  ________________________________  Sheral Apley Tammi Klippel, M.D.

## 2019-03-08 ENCOUNTER — Ambulatory Visit: Payer: Self-pay | Admitting: Cardiology

## 2023-01-06 ENCOUNTER — Telehealth: Payer: Self-pay | Admitting: Radiation Oncology

## 2023-01-06 NOTE — Telephone Encounter (Signed)
11/11 @ 2:53 pm Received medical record requested in RadOnc from [facility]. [employer] - [phone], but requested was for  Dr. Marcine Matar [facility]).  Left voicemail for requestor to resend to [facility]'s fax and phone number was given.
